# Patient Record
Sex: Female | Born: 1970 | Race: White | Hispanic: No | Marital: Married | Smoking: Former smoker
Health system: Southern US, Community
[De-identification: ages and names within clinical notes are randomized; demographics above are authoritative.]

## PROBLEM LIST (undated history)

## (undated) DIAGNOSIS — N809 Endometriosis, unspecified: Secondary | ICD-10-CM

## (undated) DIAGNOSIS — I10 Essential (primary) hypertension: Secondary | ICD-10-CM

## (undated) DIAGNOSIS — K56609 Unspecified intestinal obstruction, unspecified as to partial versus complete obstruction: Secondary | ICD-10-CM

## (undated) DIAGNOSIS — G4730 Sleep apnea, unspecified: Secondary | ICD-10-CM

## (undated) DIAGNOSIS — E785 Hyperlipidemia, unspecified: Secondary | ICD-10-CM

## (undated) HISTORY — DX: Hyperlipidemia, unspecified: E78.5

## (undated) HISTORY — PX: CHOLECYSTECTOMY: SHX55

## (undated) HISTORY — PX: ABDOMINAL HYSTERECTOMY: SHX81

## (undated) HISTORY — DX: Sleep apnea, unspecified: G47.30

## (undated) HISTORY — DX: Essential (primary) hypertension: I10

---

## 2006-12-21 ENCOUNTER — Ambulatory Visit: Payer: Self-pay | Admitting: Gynecology

## 2007-05-26 ENCOUNTER — Emergency Department: Payer: Self-pay | Admitting: Emergency Medicine

## 2007-09-27 ENCOUNTER — Ambulatory Visit: Payer: Self-pay | Admitting: Internal Medicine

## 2008-02-14 ENCOUNTER — Encounter (INDEPENDENT_AMBULATORY_CARE_PROVIDER_SITE_OTHER): Payer: Self-pay | Admitting: Gynecology

## 2008-02-14 ENCOUNTER — Ambulatory Visit: Payer: Self-pay | Admitting: Gynecology

## 2008-06-08 ENCOUNTER — Ambulatory Visit: Payer: Self-pay | Admitting: Family Medicine

## 2008-07-11 ENCOUNTER — Encounter: Payer: Self-pay | Admitting: Family Medicine

## 2008-07-11 ENCOUNTER — Ambulatory Visit: Payer: Self-pay | Admitting: Family Medicine

## 2008-07-11 ENCOUNTER — Ambulatory Visit (HOSPITAL_COMMUNITY): Admission: RE | Admit: 2008-07-11 | Discharge: 2008-07-14 | Payer: Self-pay | Admitting: Gynecology

## 2008-07-25 ENCOUNTER — Ambulatory Visit: Payer: Self-pay | Admitting: Family Medicine

## 2008-07-26 ENCOUNTER — Encounter: Payer: Self-pay | Admitting: Family Medicine

## 2008-08-22 ENCOUNTER — Ambulatory Visit: Payer: Self-pay | Admitting: Obstetrics and Gynecology

## 2009-01-25 ENCOUNTER — Ambulatory Visit: Payer: Self-pay | Admitting: Obstetrics & Gynecology

## 2009-08-25 HISTORY — PX: AUGMENTATION MAMMAPLASTY: SUR837

## 2010-06-04 ENCOUNTER — Ambulatory Visit: Payer: Self-pay | Admitting: Obstetrics & Gynecology

## 2010-06-07 ENCOUNTER — Ambulatory Visit (HOSPITAL_COMMUNITY): Admission: RE | Admit: 2010-06-07 | Discharge: 2010-06-07 | Payer: Self-pay | Admitting: Obstetrics & Gynecology

## 2010-07-01 ENCOUNTER — Ambulatory Visit (HOSPITAL_COMMUNITY): Admission: RE | Admit: 2010-07-01 | Discharge: 2010-07-01 | Payer: Self-pay | Admitting: Obstetrics and Gynecology

## 2010-07-10 ENCOUNTER — Ambulatory Visit (HOSPITAL_COMMUNITY): Admission: RE | Admit: 2010-07-10 | Discharge: 2010-07-10 | Payer: Self-pay | Admitting: Gastroenterology

## 2010-07-24 ENCOUNTER — Ambulatory Visit: Payer: Self-pay | Admitting: Obstetrics & Gynecology

## 2010-09-15 ENCOUNTER — Encounter: Payer: Self-pay | Admitting: Gastroenterology

## 2011-01-07 NOTE — Op Note (Signed)
NAMESHELLIE, ROGOFF                ACCOUNT NO.:  [id number]   MEDICAL RECORD NO.:  [id number]          PATIENT TYPE:  OIB   LOCATION:  9303                          FACILITY:  [facility]   PHYSICIAN:  Tanya S. Shawnie Pons, M.D.   DATE OF BIRTH:  1970/09/26   DATE OF PROCEDURE:  07/11/2008  DATE OF DISCHARGE:                               OPERATIVE REPORT   PREOPERATIVE DIAGNOSES:  Pelvic pain and endometriosis.   POSTOPERATIVE DIAGNOSES:  Pelvic pain and endometriosis.   PROCEDURE:  Laparoscopic-assisted vaginal hysterectomy and bilateral  salpingo-oophorectomy.   SURGEON:  [name]. Shawnie Pons, MD   ASSISTANT:  Javier Glazier. Okey Dupre, MD   ANESTHESIA:  General and local.   FINDINGS:  Left ovarian cyst and endometriosis.   SPECIMENS:  Tubes, ovaries, and uterus with cervix.   DISPOSITION:  Specimens to Pathology.   ESTIMATED BLOOD LOSS:  250 mL.   COMPLICATIONS:  None known.   REASON FOR PROCEDURE:  Briefly, the patient is a 40 year old gravida 3,  para 1-0-2-2, who has a long history of endometriosis and chronic pelvic  pain.  She has been on Depo-Lupron for over a year without significant  relief.  She has also had endometriosis ablation done laparoscopically  and in fact had had multiple laparoscopies, at least 4 plus, and  laparotomy for C-section.  The patient had been counseled and desired  permanent treatment.  The surgery had previously been performed by Dr.  Blima Rich and was done in his absence by myself.  For all of her  previous surgeries plus a history of endometriosis, an LAVH is felt to  be the prudent way to go.   PROCEDURE IN DETAIL:  The patient was taken to the OR, where she was  placed in the dorsal lithotomy in [place] stirrups.  She was prepped and  draped in the usual sterile fashion.  A Foley catheter was placed inside  the bladder.  A speculum was placed inside the vagina.  The cervix was  visualized and grasped anteriorly with single-tooth tenaculum and a  Hulka  tenaculum placed through the cervix.  The single-tooth and  speculum were removed from the vagina.  Attention was then turned to the  abdomen.  A 0.25% Marcaine was injected in the umbilicus.  A knife was  used to make an incision through the umbilicus.  This was carried down  to the fascia bilaterally and then the peritoneal cavity was entered  bluntly with hemostat.  There were no adhesions noted at the site of  incision.  The fascia on the either side was then tacked with a 0-Vicryl  suture on the UR-6 and a fine trocar was placed inside the opening.  A  pneumoperitoneum was created.  The uterus, tubes, and ovaries were  looked at and were felt to be fairly free of adhesions, hence a decision  was made to proceed with the laparoscopic portion of the hysterectomy.  The two 5-mm ports were placed under direct visualization.  The  patient's right ureter was identified well below the IP, and the IP was  taken with the  Ensure device.  After this was taken, the broad was taken  in several bites until the round was encountered.  The round was taken  down as well and a few more bites of the broad taken to the level of the  uterine.  Attempt was made to create bladder flap on this site; however,  it was not easily done.  Attention was then turned to the left.  Left  ureter was much more difficult to see, so the tubo-ovarian pedicle was  taken down with the Ensure, then the broad taken and then the round.  The anterior flap of the broad could then be entered and the bladder  flap created almost half way across the uterus without difficulty.  The  camera and the light were turned off,  the pneumoperitoneum was  disengaged, and attention was turned to the vagina.  A weighted speculum  was placed inside the vagina and the cervix grasped with 2 double-tooth  tenaculum and injected with Pitressin.  A circumferential incision was  made about the cervix and blunt dissection was used to remove the  vagina  from the cervix.  The posterior peritoneum was entered with the incision  and the posterior peritoneum was tacked to the vagina with the suture.  The anterior peritoneum was then entered sharply without difficulty.  The uterosacral ligaments were then clamped, cut, and suture ligated  with a Heaney-type suture.  Each of these was tagged.  The uterine  artery pedicles were then clamped, cut, and suture ligated with  excellent hemostasis being noted.  One more bite was made on each side  to complete the hysterectomy, which was done.  The patient's right tube  and ovary came out with the specimen.  Once this was done, wet lap pad  was placed inside the vagina, and pneumoperitoneum was recreated.  Attention was returned to the top.  The left IP could not be easily  delineated and so just as close to the ovary as we could get, we took  down the IP and the tube with excellent hemostasis being noted.  All  pedicles were looked at inside.  There was excellent hemostasis  everywhere, and so the ovary was left at the cuff and the  pneumoperitoneum was left down.  All trocars were removed under direct  visualization.  The 5-mm ports were closed with 4-0 Vicryl suture in a  running subcuticular fashion at the umbilicus, 2 figure-of-eight with  the aforementioned 0-Vicryl on the UR-6 were used to close the fascia  and a 3-0 Vicryl on a X-1 needle was used to do a subcuticular closure  of the umbilicus.  Attention was then returned to the vagina, where the  lap pads were removed.  The ovary and tube were removed.  There was  bleeding at the posterior cuff, and so a 0-Vicryl suture in a locked-  running fashion with inclusion of the uterosacral ligaments and a locked-  running fashion was used to achieve hemostasis.  There was also some  bleeding at the patient's left cuff edge and a figure-of-eight was used  here to achieve hemostasis.  The vagina was then packed with packing  soaked in  clindamycin cream.  All instrument, needle, and lap counts  were correct x2.  The patient was awakened and taken to recovery room in  stable condition.      [name]. Shawnie Pons, M.D.  Electronically Signed     TSP/MEDQ  D:  07/11/2008  T:  07/12/2008  Job:  [id number]

## 2011-01-07 NOTE — Assessment & Plan Note (Signed)
NAMEMARYN, Terri Sanchez                ACCOUNT NO.:  [id number]   MEDICAL RECORD NO.:  [id number]          PATIENT TYPE:  POB   LOCATION:  [facility] at [facility]         FACILITY:  [facility]   PHYSICIAN:  Allie Bossier, MD        DATE OF BIRTH:  11/04/1970   DATE OF SERVICE:  06/04/2010                                  CLINIC NOTE   Terri Sanchez is a 40 year old married white gravida 3, para 2, abortus 1.  She comes in here for annual exam.  She has several complaints, one is  fatigue and weight gain.  Please note that she has increased to 6 pounds  in the last year.  She also complains of IBS symptoms, which is no  different than her usual complaint, although it is now diarrhea instead  of constipation and her major GYN complaint today is pain on her left  side that began about 3 weeks ago.  She says that she notices it more  when she lies down but that may be just because she is more focused on  it.  When she lies down, it may radiate a little bit to her back.  She  has point tenderness with deep dyspareunia on the left side.  Depression-  type symptoms including no energy.  She says she gets angry and tearful.  She actually says she is more depressed and she complains of decreased  libido.   PAST MEDICAL HISTORY:  Endometriosis, chronic pelvic pain, infertility,  IBS, and she has been on Wellbutrin in the past.   PAST SURGICAL HISTORY:  Multiple laparoscopies, bilateral partial  salpingectomy, laparoscopic-assisted vaginal hysterectomy and bilateral  salpingo-oophorectomy, bilateral saline breast implants.   ALLERGIES:  DOXYCYCLINE.  No latex allergies.   MEDICATIONS:  Ibuprofen p.r.n., multivitamin daily, Vivelle-Dot 0.1 mg  patch change weekly.   PHYSICAL EXAMINATION:  GENERAL:  Well-nourished, well-hydrated, pleasant  white female, at times tearful.  VITAL SIGNS:  Height 5 feet 6 inches, weight 139, blood pressure 110/75,  pulse 63.  HEENT:  Normal.  BREAST:  Normal with implants.  HEART:  Regular rate and rhythm.  LUNGS:  Clear sensation bilaterally.  ABDOMEN:  Benign, relatively scaphoid.  EXTERNAL GENITALIA:  Shaved, no lesions.  Vaginal cuff well  estrogenized, normal discharge.  Bimanual exam, there are no masses.  She does have point tenderness AT the left aspect of the vaginal cuff.   ASSESSMENT AND PLAN:  1. Annual exam.  I have schedule a mammogram and recommended self-      breast and self-vulvar exams.  2. Lack of energy, depression symptoms.  I am checking a TSH and      restarting her Wellbutrin XL 150 mg daily.  3. Irritable bowel syndrome.  I am referring her to Dr. Graylin Shiver.      I will see her back in 4 weeks.  4. Left lower quadrant pain and dyspareunia.  I am going to order a CT      of her abdomen to see if there is anything specific that I can use      to help me diagnose the origin of her pelvic pain.  Allie Bossier, MD     MCD/MEDQ  D:  06/04/2010  T:  06/05/2010  Job:  [id number]   cc:   Graylin Shiver, MD

## 2011-01-07 NOTE — Discharge Summary (Signed)
Terri Sanchez, Terri Sanchez                ACCOUNT NO.:  [id number]   MEDICAL RECORD NO.:  [id number]          PATIENT TYPE:  OIB   LOCATION:  9303                          FACILITY:  [facility]   PHYSICIAN:  Tanya S. Shawnie Pons, M.D.   DATE OF BIRTH:  06/15/1971   DATE OF ADMISSION:  07/11/2008  DATE OF DISCHARGE:  07/14/2008                               DISCHARGE SUMMARY   FINAL DIAGNOSES:  1. Pelvic pain.  2. Endometriosis.  3. Depression.  4. History of infertility.  5. Tobacco use.   PROCEDURE:  Laparoscopically assisted vaginal hysterectomy with  bilateral salpingo-oophorectomy.   PERSISTENT LABORATORY DATA:  Preoperative hemoglobin 14.0, postoperative  hemoglobin too low at 7.4, but up to 8.4 three hours later.  Blood type  was A positive.  Antibody screen was negative. Serum pregnancy test was  also negative.   REASON FOR ADMISSION:  Briefly, please see H&P on the chart.  The  patient is a 40 year old gravida 3, para 1-0-2-2 who has a long history  of endometriosis and chronic pelvic pain who has failed a year's  treatment of Depo-Lupron, excision of endometriosis by laparoscopy who  desired permanent treatment.  She had previously been counseled by Dr.  Mia Creek about LAVH-BSO, who was no longer with the practice.  The  patient had a history of laparoscopy x4, partial bilateral salpingectomy  secondary to ectopic pregnancy, and a history of C-section.  For that  reason, laparoscopically assisted vaginal hysterectomy was undertaken to  make sure there were no significant adhesions.   HOSPITAL COURSE:  The patient was admitted on the day of surgery.  She  underwent the above procedure without difficulty.  Postoperatively, she  was transferred to the floor.  She was noted to have significant drop in  hemoglobin and she was somewhat dizzy when she got out of the hand.  However, her vital signs were stable and after 24 hours she seems  stabilize, and was able to ambulate without difficult.   The patient also  spiked a temp on postoperative day #2, and says she was kept for an  additional 24 hours.  The patient was able to tolerate regular diet.  Her Foley catheter was removed.  She was able to void without  difficulty, and she was ambulating without assistance.   Given all of these things, it was felt she was stable for discharge on  postoperative day #3.  The patient did require placement of estrogen  patch secondary to night sweats on postoperative day #2.   DISCHARGE DISPOSITION AND CONDITION:  The patient is discharged to home  in good condition.  Followup will be in two weeks at the [facility].  She will be sent home with Percocet 5/325 one to two p.o. q4-6  h. p.r.n. pain, #40 given.  Vivelle-Dot 0.05 apply to lower abdomen  twice weekly.  She was also instructed to return with fever, chills,  persistent nausea and vomiting, or significant abdominal pain.   DISCHARGE INSTRUCTIONS:  Also include no heavy lifting for the next 4  weeks and no intercourse for 4 weeks  as well.      [name]. Shawnie Pons, M.D.  Electronically Signed     TSP/MEDQ  D:  07/14/2008  T:  07/14/2008  Job:  [id number]

## 2011-01-07 NOTE — Assessment & Plan Note (Signed)
Terri Sanchez, Terri Sanchez                ACCOUNT NO.:  [id number]   MEDICAL RECORD NO.:  [id number]          PATIENT TYPE:  POB   LOCATION:  [facility]         FACILITY:  [facility]   PHYSICIAN:  Argentina Donovan, MD        DATE OF BIRTH:  [birth year]   DATE OF SERVICE:  08/22/2008                                  CLINIC NOTE   The patient is a 40 year old Caucasian female who underwent total  laparoscopic-assisted vaginal hysterectomy with bilateral salpingo-  oophorectomy for endometriosis 6 weeks ago.  She has had no significant  postoperative problems except need for an increase in her estrogen.  Vivelle 0.1 seems to be doing well for her right now and we will keep  her on that dose.  The only negative feeling that she has had is at the  end of emptying her bladder, she feels a pulling sensation that will  probably go away.  The incisions seem well healed __________ and the  patient has made satisfactory recovery.            ______________________________  Argentina Donovan, MD     PR/MEDQ  D:  08/22/2008  T:  08/23/2008  Job:  [id number]

## 2011-01-07 NOTE — Assessment & Plan Note (Signed)
Terri Sanchez, Terri Sanchez                ACCOUNT NO.:  [id number]   MEDICAL RECORD NO.:  [id number]          PATIENT TYPE:  POB   LOCATION:  [facility] at [facility]         FACILITY:  [facility]   PHYSICIAN:  Tinnie Gens, MD        DATE OF BIRTH:  1971-05-19   DATE OF SERVICE:  06/08/2008                                  CLINIC NOTE   CHIEF COMPLAINT:  Reschedule surgery.   HISTORY OF PRESENT ILLNESS:  The patient is a 40 year old gravida 3,  para 1-0-2-2, who has a longstanding history of endometriosis.  The  patient was seen by Dr. Blima Rich in June of 2009, will scheduled  for a LAVH and BSO with possible exploratory laparotomy.  Since Dr.  Mia Creek has left the practice, the patient is here to reschedule this.  He has a lengthy note.  Please see dictation from this day, but it  basically outlines all the laparoscopies and treatments that she has had  including bilateral partial salpingectomies for ectopic on each side as  well as history of IVF and failed IVF following delivery of a twins.  The patient's last laparoscopy occurred in 2005 in [place] and her  doctor there told her that she was beginning to get adhesions and scar  tissue built up from previous surgeries.  The patient has had multiple  medical treatments and none of them have really relieved her pain.  The  patient is  interested in this kind of treatment.  Lengthy discussion  was held with the patient regarding risks and benefits of this procedure  including risk of injury to bowel, bladder, ureters, risk of bleeding,  and infection.  The patient understands all these risks, did discuss  what would be needed postoperatively, how long in the hospital,  reasonable expectations for recovery.  Discussed with the patient while  we would probably be taking out her ovaries, given that she does have  diagnosis of endometriosis and that we would try how to get  back in for  surgery and needs surgery cares with more and more risk.   The patient is  worried about hormones and hormone replacement and all of this was  discussed at length with the patient.   IMPRESSION:  Endometriosis.   PLAN:  We will test again for a laparoscopic-assisted vaginal  hysterectomy with BSO mostly so that we can make sure there are no  significant adhesions of bowel or bladder prior to [facility].  We will try to  get her scheduled for July 11, 2008.  This patient has in-laws  coming and taking care of her kids.           ______________________________  Tinnie Gens, MD     TP/MEDQ  D:  06/08/2008  T:  06/09/2008  Job:  [id number]

## 2011-01-07 NOTE — Assessment & Plan Note (Signed)
Terri Sanchez, Terri Sanchez                ACCOUNT NO.:  [id number]   MEDICAL RECORD NO.:  [id number]          PATIENT TYPE:  POB   LOCATION:  [facility] at [facility]         FACILITY:  [facility]   PHYSICIAN:  Allie Bossier, MD        DATE OF BIRTH:  02/22/1971   DATE OF SERVICE:  07/24/2010                                  CLINIC NOTE   [name] is a 40 year old married white G3, P69, A52 with 46-year-old  twins.  She is here for followup visit.  At her annual visit last month,  she was complaining of fatigue, irritability, lack of energy, and  feeling like she was going to jump out of her skin especially at  night.  I restarted her Wellbutrin 150 mg and she is following up now.  Today, her symptoms are somewhat better but she says she still feels  like she is going to jump out of her skin at night.  I did check a TSH  and it was normal.  I have told her that if she wants further relief  that we probably should set her up with a psychiatrist.  I do believe  that many of her symptoms are because she is so absolutely stressed at  work.  She is a [employer] (she will get the [employer] next month)  and she is very very busy at work, but we will set her up with  psychiatrist.      Allie Bossier, MD     MCD/MEDQ  D:  07/24/2010  T:  07/25/2010  Job:  [id number]

## 2011-01-07 NOTE — Assessment & Plan Note (Signed)
Terri Sanchez, Terri Sanchez                ACCOUNT NO.:  [id number]   MEDICAL RECORD NO.:  [id number]          PATIENT TYPE:  POB   LOCATION:  [facility] at [facility]         FACILITY:  [facility]   PHYSICIAN:  Ginger Carne, MD DATE OF BIRTH:  02-05-1971   DATE OF SERVICE:  02/14/2008                                  CLINIC NOTE   This patient is a 40 year old, gravida 3, para 1-0-2-2, Caucasian female  who has had a longstanding history over the past 5-10 years of  significant symptomatology related to endometriosis.  The patient has  had a laparoscopic diagnosis of same in 2004 and 2005.  She had  undergone laparoscopic excision and ablation of endometriosis in [place]  in 2005 at which time she was diagnosed with stage 3 endometriosis.  Since then, she has been struggling with dysmenorrhea, dyspareunia, and  pain throughout the month.  The patient is at a point now where she is  unable to engage in intercourse.  She has been on a course of a Depo-  Lupron for over 1 year, norethindrone acetate with and without oral  contraceptives, and Depo-Provera without benefit.  She had ever utilized  Danocrine.  The patient had attempted in June 2008 in vitro  fertilization twice unsuccessfully.  It was the opinion of the  Reproductive Endocrinology Department that her ovaries had endometriomas  and were not receptive to assisted reproductive technology medication.  The patient has had bilateral salpingectomies due to ectopic pregnancies  as the reason for her in vitro efforts.   In addition, the patient has had symptoms related to depression.  She  has a set of twins who are 42 years old.  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  She has  bouts of crying and difficulty coping.  The patient had sought counsel  as far as dealing with these issues as well.  There is no evidence for  psychological or physical domestic violence and the patient has not  had  a past history of depression or bipolar disease.   The patient has no GI, GU, or musculoskeletal sources for her pelvic  pain and specifically has no symptomatology of an overactive bladder or  interstitial cystitis.   OB/GYN HISTORY:  The patient has had in vitro fertilization resulting in  twin gestation.  She has also had 2 first trimester miscarriages.   ALLERGIES:  DOXYCYCLINE   CURRENT MEDICATIONS:  None.   PAST SURGICAL HISTORY:  The patient in 2004 had a diagnostic laparoscopy  revealing stage 3 endometriosis, which was confirmed by laparoscopic  excision of endometriosis in 2005 in [place].  The patient has not had  any other form of surgery.   SOCIAL HISTORY:  The patient has started smoking 3 months ago, but was  encouraged to stop.  Denies alcohol or illicit drug abuse.   FAMILY HISTORY:  No first-degree relatives with breast, colon, ovarian,  or uterine carcinoma.   MEDICAL HISTORY:  None.   REVIEW OF SYSTEMS:  A 14-point comprehensive review of systems within  normal limits.   PHYSICAL EXAMINATION:  VITAL SIGNS:  Blood pressure  110/58, weight 132  pounds, height 5 feet 7 inches, and pulse 72 and regular.  HEENT:  Grossly normal.  BREAST:  Without masses, discharge, thickenings, or tenderness.  CHEST:  Clear to percussion and auscultation.  CARDIOVASCULAR:  Without murmurs or enlargements.  Regular rate and  rhythm.  EXTREMITY, LYMPHATIC, SKIN, NEUROLOGICAL, MUSCULOSKELETAL SYSTEMS:  Within normal limits.  ABDOMEN:  Soft without gross hepatosplenomegaly.  PELVIC:  Pap smear performed.  External genitalia, vulva, and vagina  normal.  Cervix smooth without erosions or lesions.  Uterus is small,  anteverted, flexed, and tender.  Both adnexa palpable and found to be  normal.   IMPRESSION:  1. Chronic pelvic pain with endometriosis.  2. Depression.   PLAN:  The patient has had a full exposure to medical therapy for  endometriosis including Depo-Lupron,  Depo Provera, continual oral  contraceptives associated with norethindrone acetate.  Her menses are  regular about every 25-28 days, lasting 5-6 days.  She has no desire for  further attempts at in vitro fertilization as she has had both tubes  removed in the past.  The patient has decided on definitive management  of her endometriosis and has elected to undergo a laparoscopic-assisted  vaginal hysterectomy, bilateral oophorectomy with possible excision of  tubal remnants, possible appendectomy, and laparotomy.  The nature of  said procedure discussed in detail.  Risks including possible injuries  to ureter, bowel, and bladder, possible conversion to an open procedure,  hemorrhage requiring blood transfusion, infection, and other unforeseen  complications were discussed and understood by said patient.  1. In addressing the patient's depression, she was started on      Wellbutrin 150 mg XL.  She was advised as to side effects including      rash and was encouraged to take it in the morning.  She understands      it may take upwards of 4-6 weeks for the drug to be fully      beneficial.  An additional dosing or changes in medication may be      necessary.  She was also provided Xanax on an interim basis 0.5 mg      1-2 up to 3 times a day, #60 with 1 refill to deal with anxiety      until Wellbutrin settles in.  The patient will contact our office      in the next several days to arrange for surgery time and was      encouraged to contact our office if she has any untoward effects      related to Wellbutrin and/or Xanax.           ______________________________  Ginger Carne, MD     SHB/MEDQ  D:  02/14/2008  T:  02/15/2008  Job:  [id number]

## 2011-01-07 NOTE — Assessment & Plan Note (Signed)
NAMEARTI, Terri Sanchez                ACCOUNT NO.:  [id number]   MEDICAL RECORD NO.:  [id number]          PATIENT TYPE:  POB   LOCATION:  [facility] at [facility]         FACILITY:  [facility]   PHYSICIAN:  Tinnie Gens, MD        DATE OF BIRTH:  [birth year]   DATE OF SERVICE:  07/25/2008                                  CLINIC NOTE   CHIEF COMPLAINT:  Postop check.   HISTORY OF PRESENT ILLNESS:  The patient is 40 year old who was status  post LAVH.  She is really having quite a few complications.  She reports  that she has the inability to sleep, frequent night sweats and frequent  hot flashes.  She is currently on 0.05 mg of Vivelle-Dot, but continues  to have issues with menopause.  She does have painful urination, some  low back pain, and vaginal discharge that smells.  She also has a  painful bowel movements and has been on stool softeners and flaxseed  oil.   PHYSICAL EXAMINATION:  VITAL SIGNS:  Today her vitals are as noted in  the chart.  GENERAL:  She is a well-nourished female in no acute distress.  ABDOMEN:  Soft, nontender, nondistended.  Her incisions are healing  well.  GENITOURINARY:  Normal external female genitalia.  Vagina is pink and  rugated.  Incision at the top of the vagina is healing well.  There is a  pink yellow discharge noted that does have odor and appears normal.  Sutures are still intact.  There is no significant masses on top of the  vagina.  Urinalysis today shows moderate blood and 2+ leukocytes.   IMPRESSION:  1. Postop check.  2. Continued menopausal symptoms on hormone replacement.  3. Urinary tract infection.   PLAN:  1. Keflex 500 mg 1 p.o. t.i.d. for 7 days.  2. Increase Vivelle-Dot to 0.1, sample of this was given to the      patient.  3. Follow up in 4 weeks.           ______________________________  Tinnie Gens, MD     TP/MEDQ  D:  07/25/2008  T:  07/26/2008  Job:  [id number]

## 2011-05-27 LAB — CBC
HCT: 21.4 — ABNORMAL LOW
HCT: 22.4 — ABNORMAL LOW
HCT: 24.4 — ABNORMAL LOW
HCT: 40.8
Hemoglobin: 14
MCHC: 34.4
MCHC: 34.8
MCV: 93.2
MCV: 93.7
MCV: 94.4
Platelets: 188
Platelets: 194
Platelets: 220
RBC: 4.37
RDW: 12.6
RDW: 13.4
WBC: 8.9

## 2011-05-27 LAB — CROSSMATCH: ABO/RH(D): A POS

## 2011-05-27 LAB — ABO/RH: ABO/RH(D): A POS

## 2012-04-28 ENCOUNTER — Telehealth: Payer: Self-pay

## 2012-04-28 NOTE — Telephone Encounter (Signed)
Medical Records were sent on this patient to [facility] on 04/28/12 by SR

## 2016-03-19 DIAGNOSIS — M2392 Unspecified internal derangement of left knee: Secondary | ICD-10-CM | POA: Insufficient documentation

## 2016-05-29 ENCOUNTER — Inpatient Hospital Stay
Admission: EM | Admit: 2016-05-29 | Discharge: 2016-06-01 | DRG: 392 | Disposition: A | Discharge status: Home / Self Care | Payer: BLUE CROSS/BLUE SHIELD | Attending: Internal Medicine | Admitting: Internal Medicine

## 2016-05-29 ENCOUNTER — Emergency Department: Payer: BLUE CROSS/BLUE SHIELD

## 2016-05-29 ENCOUNTER — Encounter: Payer: Self-pay | Admitting: Emergency Medicine

## 2016-05-29 DIAGNOSIS — R109 Unspecified abdominal pain: Secondary | ICD-10-CM | POA: Diagnosis present

## 2016-05-29 DIAGNOSIS — K449 Diaphragmatic hernia without obstruction or gangrene: Secondary | ICD-10-CM | POA: Diagnosis present

## 2016-05-29 DIAGNOSIS — Z87891 Personal history of nicotine dependence: Secondary | ICD-10-CM | POA: Diagnosis not present

## 2016-05-29 DIAGNOSIS — K56609 Unspecified intestinal obstruction, unspecified as to partial versus complete obstruction: Secondary | ICD-10-CM | POA: Diagnosis not present

## 2016-05-29 DIAGNOSIS — Z9049 Acquired absence of other specified parts of digestive tract: Secondary | ICD-10-CM

## 2016-05-29 DIAGNOSIS — Z0189 Encounter for other specified special examinations: Secondary | ICD-10-CM

## 2016-05-29 DIAGNOSIS — K2900 Acute gastritis without bleeding: Principal | ICD-10-CM | POA: Diagnosis present

## 2016-05-29 DIAGNOSIS — Z90710 Acquired absence of both cervix and uterus: Secondary | ICD-10-CM | POA: Diagnosis not present

## 2016-05-29 DIAGNOSIS — R197 Diarrhea, unspecified: Secondary | ICD-10-CM | POA: Diagnosis present

## 2016-05-29 DIAGNOSIS — K565 Intestinal adhesions [bands], unspecified as to partial versus complete obstruction: Secondary | ICD-10-CM

## 2016-05-29 HISTORY — DX: Endometriosis, unspecified: N80.9

## 2016-05-29 HISTORY — DX: Unspecified intestinal obstruction, unspecified as to partial versus complete obstruction: K56.609

## 2016-05-29 LAB — TROPONIN I

## 2016-05-29 LAB — CBC
HCT: 41.1 % (ref 35.0–47.0)
HEMOGLOBIN: 14.6 g/dL (ref 12.0–16.0)
MCH: 32.9 pg (ref 26.0–34.0)
MCHC: 35.5 g/dL (ref 32.0–36.0)
MCV: 92.5 fL (ref 80.0–100.0)
Platelets: 240 10*3/uL (ref 150–440)
RBC: 4.45 MIL/uL (ref 3.80–5.20)
RDW: 12.2 % (ref 11.5–14.5)
WBC: 7.3 10*3/uL (ref 3.6–11.0)

## 2016-05-29 LAB — BASIC METABOLIC PANEL
ANION GAP: 8 (ref 5–15)
BUN: 10 mg/dL (ref 6–20)
CALCIUM: 9.6 mg/dL (ref 8.9–10.3)
CO2: 26 mmol/L (ref 22–32)
Chloride: 102 mmol/L (ref 101–111)
Creatinine, Ser: 0.83 mg/dL (ref 0.44–1.00)
Glucose, Bld: 168 mg/dL — ABNORMAL HIGH (ref 65–99)
Potassium: 3.8 mmol/L (ref 3.5–5.1)
Sodium: 136 mmol/L (ref 135–145)

## 2016-05-29 LAB — HEPATIC FUNCTION PANEL
ALT: 42 U/L (ref 14–54)
AST: 43 U/L — AB (ref 15–41)
Albumin: 4.5 g/dL (ref 3.5–5.0)
Alkaline Phosphatase: 82 U/L (ref 38–126)
TOTAL PROTEIN: 7.8 g/dL (ref 6.5–8.1)
Total Bilirubin: 1.1 mg/dL (ref 0.3–1.2)

## 2016-05-29 LAB — LIPASE, BLOOD: Lipase: 43 U/L (ref 11–51)

## 2016-05-29 MED ORDER — HYDROMORPHONE HCL 1 MG/ML IJ SOLN
1.0000 mg | Freq: Once | INTRAMUSCULAR | Status: AC
  Administered 2016-05-29: 1 mg via INTRAVENOUS
  Filled 2016-05-29: qty 1

## 2016-05-29 MED ORDER — HEPARIN SODIUM (PORCINE) 5000 UNIT/ML IJ SOLN
5000.0000 [IU] | Freq: Three times a day (TID) | INTRAMUSCULAR | Status: DC
  Administered 2016-05-29 – 2016-05-30 (×2): 5000 [IU] via SUBCUTANEOUS
  Filled 2016-05-29 (×2): qty 1

## 2016-05-29 MED ORDER — GI COCKTAIL ~~LOC~~
30.0000 mL | Freq: Once | ORAL | Status: AC
  Administered 2016-05-29: 30 mL via ORAL
  Filled 2016-05-29: qty 30

## 2016-05-29 MED ORDER — FAMOTIDINE IN NACL 20-0.9 MG/50ML-% IV SOLN
20.0000 mg | Freq: Two times a day (BID) | INTRAVENOUS | Status: DC
  Administered 2016-05-29: 20 mg via INTRAVENOUS
  Filled 2016-05-29 (×3): qty 50

## 2016-05-29 MED ORDER — IOPAMIDOL (ISOVUE-300) INJECTION 61%
100.0000 mL | Freq: Once | INTRAVENOUS | Status: AC | PRN
  Administered 2016-05-29: 100 mL via INTRAVENOUS

## 2016-05-29 MED ORDER — KETOROLAC TROMETHAMINE 30 MG/ML IJ SOLN
30.0000 mg | Freq: Four times a day (QID) | INTRAMUSCULAR | Status: DC
  Administered 2016-05-30 – 2016-05-31 (×6): 30 mg via INTRAVENOUS
  Filled 2016-05-29 (×6): qty 1

## 2016-05-29 MED ORDER — ONDANSETRON HCL 4 MG/2ML IJ SOLN
4.0000 mg | Freq: Once | INTRAMUSCULAR | Status: AC
Start: 2016-05-29 — End: 2016-05-29
  Administered 2016-05-29: 4 mg via INTRAVENOUS
  Filled 2016-05-29: qty 2

## 2016-05-29 MED ORDER — ONDANSETRON HCL 4 MG/2ML IJ SOLN
4.0000 mg | Freq: Once | INTRAMUSCULAR | Status: AC
  Administered 2016-05-29: 4 mg via INTRAVENOUS
  Filled 2016-05-29: qty 2

## 2016-05-29 MED ORDER — SODIUM CHLORIDE 0.9 % IV SOLN
Freq: Once | INTRAVENOUS | Status: AC
  Administered 2016-05-29: 1000 mL via INTRAVENOUS

## 2016-05-29 MED ORDER — PHENOL 1.4 % MT LIQD
1.0000 | OROMUCOSAL | Status: DC | PRN
  Administered 2016-05-30 (×2): 1 via OROMUCOSAL
  Filled 2016-05-29: qty 177

## 2016-05-29 MED ORDER — IOPAMIDOL (ISOVUE-300) INJECTION 61%
30.0000 mL | Freq: Once | INTRAVENOUS | Status: AC | PRN
  Administered 2016-05-29: 30 mL via ORAL

## 2016-05-29 MED ORDER — SODIUM CHLORIDE 0.9 % IV BOLUS (SEPSIS)
500.0000 mL | Freq: Once | INTRAVENOUS | Status: AC
  Administered 2016-05-29: 500 mL via INTRAVENOUS

## 2016-05-29 MED ORDER — ONDANSETRON HCL 4 MG/2ML IJ SOLN
4.0000 mg | Freq: Four times a day (QID) | INTRAMUSCULAR | Status: DC | PRN
  Administered 2016-05-30: 4 mg via INTRAVENOUS
  Filled 2016-05-29: qty 2

## 2016-05-29 MED ORDER — ONDANSETRON 4 MG PO TBDP: 4.0000 mg | ORAL_TABLET | Freq: Four times a day (QID) | ORAL | Status: DC | PRN

## 2016-05-29 MED ORDER — LACTATED RINGERS IV SOLN
125.0000 mL/h | INTRAVENOUS | Status: DC
  Administered 2016-05-29 – 2016-05-30 (×2): 125 mL/h via INTRAVENOUS

## 2016-05-29 MED ORDER — HYDROMORPHONE HCL 1 MG/ML IJ SOLN
0.5000 mg | INTRAMUSCULAR | Status: DC | PRN
  Administered 2016-05-29 – 2016-05-30 (×3): 0.5 mg via INTRAVENOUS
  Filled 2016-05-29 (×3): qty 1

## 2016-05-29 NOTE — H&P (Signed)
Date of Admission:  05/29/2016  Reason for Admission:  Small bowel obstruction  History of Present Illness: Terri Sanchez is a 45 y.o. female who presents with a 4-6 day history of abdominal discomfort.  Patient reports that it started with her feeling bloated and with her abdomen feeling tighter and this has progressed to nausea although no episodes of emesis and now also she has been having lightheadedness and some dizziness. During this time though she does report having flatus and liquid bowel movements which is her normal. She presented today for further evaluation and was found to have a partial small bowel obstruction with proximal dilated small bowel loops and a transition point in the distal small bowel. She denies any fevers, chills, chest pain, or shortness of breath. She also denies any hematuria dysuria or blood in the stools.  Past Medical History: Past Medical History:  Diagnosis Date  . Bowel obstruction x1   . Endometriosis      Past Surgical History: Past Surgical History:  Procedure Laterality Date  . ABDOMINAL HYSTERECTOMY    . CESAREAN SECTION    . CHOLECYSTECTOMY Appendectomy Multiple laparoscopies for endometriosis and ectopic pregnancy      Home Medications: Prior to Admission medications   Medication Sig Start Date End Date Taking? Authorizing Provider  acidophilus (RISAQUAD) CAPS capsule Take 1 capsule by mouth daily.   Yes Historical Provider, MD  Multiple Vitamin (MULTIVITAMIN) tablet Take 1 tablet by mouth daily.   Yes Historical Provider, MD  OMEGA-3-ACID ETHYL ESTERS PO Take 1 capsule by mouth daily.   Yes Historical Provider, MD    Allergies: Allergies  Allergen Reactions  . Doxycycline Anaphylaxis    Social History:  reports that she has quit smoking. She has never used smokeless tobacco. She reports that she drinks about 1.2 oz of alcohol per week . She reports that she does not use drugs.   Family History: No family history on  file.  Review of Systems: Review of Systems  Constitutional: Positive for malaise/fatigue. Negative for chills and fever.  Eyes: Negative for blurred vision.  Respiratory: Negative for cough and shortness of breath.   Cardiovascular: Negative for chest pain and leg swelling.  Gastrointestinal: Positive for abdominal pain and nausea. Negative for blood in stool, constipation, diarrhea, heartburn and vomiting.  Genitourinary: Negative for dysuria and hematuria.  Musculoskeletal: Negative for myalgias.  Skin: Negative for rash.  Neurological: Positive for dizziness. Negative for headaches.  Psychiatric/Behavioral: Negative for depression.    Physical Exam BP (!) 142/100   Pulse 80   Temp 97.7 F (36.5 C) (Oral)   Resp 18   Ht 5\' 7"  (1.702 m)   Wt 65.8 kg (145 lb)   SpO2 96%   BMI 22.71 kg/m  CONSTITUTIONAL: No acute distress HEENT:  Normocephalic, atraumatic, extraocular motion intact. NECK: Trachea is midline, and there is no jugular venous distension.  LYMPH NODES:  Lymph nodes in the neck are not enlarged. RESPIRATORY:  Lungs are clear, and breath sounds are equal bilaterally. Normal respiratory effort without pathologic use of accessory muscles. CARDIOVASCULAR: Heart is regular without murmurs, gallops, or rubs. GI: The abdomen is soft, nondistended, with mild tenderness in the mid abdomen and epigastric region. Patient has multiple laparoscopic scars consistent with her previous surgeries. There were no palpable masses. There was no hepatosplenomegaly. MUSCULOSKELETAL:  Normal muscle strength and tone in all four extremities.  No peripheral edema or cyanosis. SKIN: Skin turgor is normal. There are no pathologic skin lesions.  NEUROLOGIC:  Motor and sensation is grossly normal.  Cranial nerves are grossly intact. PSYCH:  Alert and oriented to person, place and time. Affect is normal.  Laboratory Analysis: Results for orders placed or performed during the hospital encounter of  05/29/16 (from the past 24 hour(s))  Basic metabolic panel     Status: Abnormal   Collection Time: 05/29/16 11:13 AM  Result Value Ref Range   Sodium 136 135 - 145 mmol/L   Potassium 3.8 3.5 - 5.1 mmol/L   Chloride 102 101 - 111 mmol/L   CO2 26 22 - 32 mmol/L   Glucose, Bld 168 (H) 65 - 99 mg/dL   BUN 10 6 - 20 mg/dL   Creatinine, Ser 1.61 0.44 - 1.00 mg/dL   Calcium 9.6 8.9 - 09.6 mg/dL   GFR calc non Af Amer >60 >60 mL/min   GFR calc Af Amer >60 >60 mL/min   Anion gap 8 5 - 15  CBC     Status: None   Collection Time: 05/29/16 11:13 AM  Result Value Ref Range   WBC 7.3 3.6 - 11.0 K/uL   RBC 4.45 3.80 - 5.20 MIL/uL   Hemoglobin 14.6 12.0 - 16.0 g/dL   HCT 04.5 40.9 - 81.1 %   MCV 92.5 80.0 - 100.0 fL   MCH 32.9 26.0 - 34.0 pg   MCHC 35.5 32.0 - 36.0 g/dL   RDW 91.4 78.2 - 95.6 %   Platelets 240 150 - 440 K/uL  Troponin I     Status: None   Collection Time: 05/29/16 11:13 AM  Result Value Ref Range   Troponin I <0.03 <0.03 ng/mL  Hepatic function panel     Status: Abnormal   Collection Time: 05/29/16 11:13 AM  Result Value Ref Range   Total Protein 7.8 6.5 - 8.1 g/dL   Albumin 4.5 3.5 - 5.0 g/dL   AST 43 (H) 15 - 41 U/L   ALT 42 14 - 54 U/L   Alkaline Phosphatase 82 38 - 126 U/L   Total Bilirubin 1.1 0.3 - 1.2 mg/dL   Bilirubin, Direct <2.1 (L) 0.1 - 0.5 mg/dL   Indirect Bilirubin NOT CALCULATED 0.3 - 0.9 mg/dL  Lipase, blood     Status: None   Collection Time: 05/29/16 11:13 AM  Result Value Ref Range   Lipase 43 11 - 51 U/L    Imaging: Dg Chest 2 View  Result Date: 05/29/2016 CLINICAL DATA:  Chest pain. EXAM: CHEST  2 VIEW COMPARISON:  No recent prior . FINDINGS: Mediastinum and hilar structures normal. Lungs are clear. No pleural effusion or pneumothorax. Heart size normal. IMPRESSION: No acute cardiopulmonary disease. Electronically Signed   By: Maisie Fus  Register   On: 05/29/2016 13:24   Ct Abdomen Pelvis W Contrast  Result Date: 05/29/2016 CLINICAL DATA:   Epigastric abdominal pain. Previous cholecystectomy, hysterectomy, C-section and small bowel obstruction. Ex-smoker. EXAM: CT ABDOMEN AND PELVIS WITH CONTRAST TECHNIQUE: Multidetector CT imaging of the abdomen and pelvis was performed using the standard protocol following bolus administration of intravenous contrast. CONTRAST:  ISOVUE-300 IOPAMIDOL (ISOVUE-300) INJECTION 61% COMPARISON:  06/07/2010. FINDINGS: Lower chest: Bilateral breast implants.  Clear lung bases. Hepatobiliary: Minimal diffuse low density of the liver relative to the spleen. Surgically absent gallbladder. Pancreas: Unremarkable. No pancreatic ductal dilatation or surrounding inflammatory changes. Spleen: Normal in size without focal abnormality. Adrenals/Urinary Tract: Adrenal glands are unremarkable. Kidneys are normal, without renal calculi, focal lesion, or hydronephrosis. Bladder is unremarkable. Stomach/Bowel: Multiple dilated  proximal small bowel loops with normal caliber distal small bowel and colon. No obstructing mass seen. Multiple sigmoid colon diverticula. Probable post appendectomy surgical sutures. No evidence of appendicitis. Normal appearing stomach. Vascular/Lymphatic: Minimal aortic calcification. No enlarged lymph nodes. Reproductive: Surgically absent uterus. Probable small left ovary or ovarian remnant without significant change. No adnexal mass. Other: None. Musculoskeletal: Minimal lumbar spine degenerative changes. IMPRESSION: 1. Partial small bowel obstruction, most likely due to an adhesion. 2. Sigmoid diverticulosis. 3. Minimal diffuse hepatic steatosis. 4. Minimal aortic atherosclerosis. Electronically Signed   By: Beckie SaltsSteven  Reid M.D.   On: 05/29/2016 15:06   Dg Abd Portable 1v  Result Date: 05/29/2016 CLINICAL DATA:  Nasogastric tube placement, right-sided abdominal pain for 1 week. EXAM: PORTABLE ABDOMEN - 1 VIEW COMPARISON:  05/29/2016 CT FINDINGS: The tip of a gastric tube is seen 3.4 cm below the left  hemidiaphragm. The side-port is still above the diaphragm. Further advancement is recommended at least another 7 cm. Enteric contrast is seen within nonobstructed, nondistended appearing large bowel. Renal collecting system opacification noted from recent CT. No free air. IMPRESSION: Further advancement of a gastric tube is suggested by at least 7 cm. Side port is still above the level of the diaphragm. Electronically Signed   By: Tollie Ethavid  Kwon M.D.   On: 05/29/2016 17:46    Assessment and Plan: This is a 45 y.o. female who presents with a new episode of small bowel obstruction. I personally reviewed all her laboratory studies and imaging studies. An NG tube was already placed in the emergency room the patient reports some improvement in her symptoms after that. The NG tube was already advanced as recommended per etiology. The patient is not peritoneal and will be admitted to the surgery service for conservative management of her small bowel obstruction. It is reassuring that on the KUB that was done for tube placement check she already had contrast in her colon. We will continue the NG tube to wall suction and keep her nothing by mouth with IV fluid hydration and pain/nausea control and await for improvement in her symptoms and bowel function. The patient understands this plan and all her questions have been answered.   Howie IllJose Luis Rodney Wigger, MD [facility]

## 2016-05-29 NOTE — ED Provider Notes (Signed)
Time Seen: Approximately 1209  I have reviewed the triage notes  Chief Complaint: Chest Pain   History of Present Illness: Terri Sanchez is a 45 y.o. female who presents with a one-week history of abdominal pain with nausea and decreased appetite. Patient states a history of multiple abdominal surgeries and states she's had a previous cholecystectomy along with a postoperative bowel obstruction. She states she's had a complete hysterectomy and appendectomy also. Patient points mainly to the epigastric area and states pains radiate straight through to the back. She denies any history of significant alcohol usage. She denies any flank discomfort. She denies any anterior lower abdominal pain or chest discomfort to this historian. She denies any shortness of breath or a slight pleuritic component. She has had a history of gastritis in the past and denies any melena or hematochezia.   Past Medical History:  Diagnosis Date  . Bowel obstruction   . Endometriosis     There are no active problems to display for this patient.   Past Surgical History:  Procedure Laterality Date  . ABDOMINAL HYSTERECTOMY    . CESAREAN SECTION    . CHOLECYSTECTOMY      Past Surgical History:  Procedure Laterality Date  . ABDOMINAL HYSTERECTOMY    . CESAREAN SECTION    . CHOLECYSTECTOMY        Allergies:  Doxycycline  Family History: No family history on file.  Social History: Social History  Substance Use Topics  . Smoking status: Former Games developer  . Smokeless tobacco: Never Used     Comment: pt states she vapes  . Alcohol use 1.2 oz/week    2 Glasses of wine per week     Review of Systems:   10 point review of systems was performed and was otherwise negative:  Constitutional: No fever Eyes: No visual disturbances ENT: No sore throat, ear pain Cardiac: No chest pain Respiratory: No shortness of breath, wheezing, or stridor Abdomen:Pain is primarily epigastric region with radiation  straight through to the back Endocrine: No weight loss, No night sweats Extremities: No peripheral edema, cyanosis Skin: No rashes, easy bruising Neurologic: No focal weakness, trouble with speech or swollowing Urologic: No dysuria, Hematuria, or urinary frequency   Physical Exam:  ED Triage Vitals  Enc Vitals Group     BP 05/29/16 1111 (!) 146/86     Pulse Rate 05/29/16 1111 88     Resp 05/29/16 1111 18     Temp 05/29/16 1111 97.7 F (36.5 C)     Temp Source 05/29/16 1111 Oral     SpO2 05/29/16 1111 100 %     Weight 05/29/16 1112 145 lb (65.8 kg)     Height 05/29/16 1112 5\' 7"  (1.702 m)     Head Circumference --      Peak Flow --      Pain Score 05/29/16 1116 8     Pain Loc --      Pain Edu? --      Excl. in GC? --     General: Awake , Alert , and Oriented times 3; GCS 15 Head: Normal cephalic , atraumatic Eyes: Pupils equal , round, reactive to light Nose/Throat: No nasal drainage, patent upper airway without erythema or exudate.  Neck: Supple, Full range of motion, No anterior adenopathy or palpable thyroid masses Lungs: Clear to ascultation without wheezes , rhonchi, or rales Heart: Regular rate, regular rhythm without murmurs , gallops , or rubs Abdomen: Reproducible abdominal pain epigastric area  without rebound guarding rigidity. Bowel sounds are positive slightly hyperactive and symmetric in all 4 quadrants. No abdominal distention.        Extremities: 2 plus symmetric pulses. No edema, clubbing or cyanosis Neurologic: normal ambulation, Motor symmetric without deficits, sensory intact Skin: warm, dry, no rashes   Labs:   All laboratory work was reviewed including any pertinent negatives or positives listed below:  Labs Reviewed  BASIC METABOLIC PANEL - Abnormal; Notable for the following:       Result Value   Glucose, Bld 168 (*)    All other components within normal limits  CBC  TROPONIN I  HEPATIC FUNCTION PANEL  LIPASE, BLOOD    EKG:  ED ECG  REPORT I, Jennye MoccasinBrian S Landrey Mahurin, the attending physician, personally viewed and interpreted this ECG.  Date: 05/29/2016 EKG Time:1112 Rate: 90 Rhythm: normal sinus rhythm QRS Axis: normal Intervals: normal ST/T Wave abnormalities: normal Conduction Disturbances: none Narrative Interpretation: unremarkable    Radiology:  "Dg Chest 2 View  Result Date: 05/29/2016 CLINICAL DATA:  Chest pain. EXAM: CHEST  2 VIEW COMPARISON:  No recent prior . FINDINGS: Mediastinum and hilar structures normal. Lungs are clear. No pleural effusion or pneumothorax. Heart size normal. IMPRESSION: No acute cardiopulmonary disease. Electronically Signed   By: Maisie Fushomas  Register   On: 05/29/2016 13:24   Ct Abdomen Pelvis W Contrast  Result Date: 05/29/2016 CLINICAL DATA:  Epigastric abdominal pain. Previous cholecystectomy, hysterectomy, C-section and small bowel obstruction. Ex-smoker. EXAM: CT ABDOMEN AND PELVIS WITH CONTRAST TECHNIQUE: Multidetector CT imaging of the abdomen and pelvis was performed using the standard protocol following bolus administration of intravenous contrast. CONTRAST:  100mL ISOVUE-300 IOPAMIDOL (ISOVUE-300) INJECTION 61% COMPARISON:  06/07/2010. FINDINGS: Lower chest: Bilateral breast implants.  Clear lung bases. Hepatobiliary: Minimal diffuse low density of the liver relative to the spleen. Surgically absent gallbladder. Pancreas: Unremarkable. No pancreatic ductal dilatation or surrounding inflammatory changes. Spleen: Normal in size without focal abnormality. Adrenals/Urinary Tract: Adrenal glands are unremarkable. Kidneys are normal, without renal calculi, focal lesion, or hydronephrosis. Bladder is unremarkable. Stomach/Bowel: Multiple dilated proximal small bowel loops with normal caliber distal small bowel and colon. No obstructing mass seen. Multiple sigmoid colon diverticula. Probable post appendectomy surgical sutures. No evidence of appendicitis. Normal appearing stomach. Vascular/Lymphatic:  Minimal aortic calcification. No enlarged lymph nodes. Reproductive: Surgically absent uterus. Probable small left ovary or ovarian remnant without significant change. No adnexal mass. Other: None. Musculoskeletal: Minimal lumbar spine degenerative changes. IMPRESSION: 1. Partial small bowel obstruction, most likely due to an adhesion. 2. Sigmoid diverticulosis. 3. Minimal diffuse hepatic steatosis. 4. Minimal aortic atherosclerosis. Electronically Signed   By: Beckie SaltsSteven  Reid M.D.   On: 05/29/2016 15:06  "    I personally reviewed the radiologic studies   Procedures:  Patient received a NG tube per the nursing staff.    ED Course: Differential diagnosis includes but is not exclusive to acute cholecystitis, intrathoracic causes for epigastric abdominal pain, gastritis, duodenitis, pancreatitis, small bowel or large bowel obstruction, abdominal aortic aneurysm, hernia, gastritis, etc.  Given the patient's current clinical presentation and objective findings appears she has partial small bowel obstruction. Patient was given Dilaudid and Zofran for pain and offered some temporary improvement however her feelings of nausea and have returned. Patient's not had any persistent vomiting here in emergency department though has limited by mouth intake over the last week. Patient tolerated her study well. I reviewed the case with general surgery is agreed to see and evaluate the patient, further  disposition and management depends upon their evaluation. Clinical Course     Assessment:  Partial small bowel obstruction     Plan:  Inpatient Surgical consultation            Jennye Moccasin, MD 05/29/16 [id number]

## 2016-05-29 NOTE — ED Triage Notes (Signed)
Pt presents to ED with reports of chest pain for approximately one week that worsened last night. Pt reports pain radiates to her back.

## 2016-05-29 NOTE — ED Notes (Addendum)
NG tube advanced 9 cm. Secured by tape at 59 cm at the nare.

## 2016-05-30 LAB — BASIC METABOLIC PANEL
Anion gap: 5 (ref 5–15)
BUN: 9 mg/dL (ref 6–20)
CALCIUM: 8.9 mg/dL (ref 8.9–10.3)
CHLORIDE: 106 mmol/L (ref 101–111)
CO2: 27 mmol/L (ref 22–32)
CREATININE: 0.84 mg/dL (ref 0.44–1.00)
GFR calc non Af Amer: >60 mL/min (ref >60)
Glucose, Bld: 91 mg/dL (ref 65–99)
Potassium: 4.1 mmol/L (ref 3.5–5.1)
SODIUM: 138 mmol/L (ref 135–145)

## 2016-05-30 LAB — CBC WITH DIFFERENTIAL/PLATELET
BASOS ABS: 0 10*3/uL (ref 0–0.1)
BASOS PCT: 1 %
EOS ABS: 0.3 10*3/uL (ref 0–0.7)
EOS PCT: 4 %
HEMATOCRIT: 35 % (ref 35.0–47.0)
Hemoglobin: 12.6 g/dL (ref 12.0–16.0)
LYMPHS PCT: 42 %
Lymphs Abs: 3.1 10*3/uL (ref 1.0–3.6)
MCH: 33.2 pg (ref 26.0–34.0)
MCHC: 35.9 g/dL (ref 32.0–36.0)
MCV: 92.4 fL (ref 80.0–100.0)
Monocytes Absolute: 0.8 10*3/uL (ref 0.2–0.9)
Monocytes Relative: 11 %
Neutro Abs: 3.1 10*3/uL (ref 1.4–6.5)
Neutrophils Relative %: 42 %
PLATELETS: 182 10*3/uL (ref 150–440)
RBC: 3.79 MIL/uL — ABNORMAL LOW (ref 3.80–5.20)
RDW: 12.2 % (ref 11.5–14.5)
WBC: 7.3 10*3/uL (ref 3.6–11.0)

## 2016-05-30 LAB — MAGNESIUM: MAGNESIUM: 1.9 mg/dL (ref 1.7–2.4)

## 2016-05-30 MED ORDER — LACTATED RINGERS IV SOLN
INTRAVENOUS | Status: DC
  Administered 2016-05-30 – 2016-05-31 (×4): via INTRAVENOUS

## 2016-05-30 MED ORDER — MORPHINE SULFATE (PF) 2 MG/ML IV SOLN
2.0000 mg | INTRAVENOUS | Status: DC | PRN
Start: 2016-05-30 — End: 2016-05-31
  Administered 2016-05-30: 2 mg via INTRAVENOUS
  Filled 2016-05-30: qty 1

## 2016-05-30 MED ORDER — MORPHINE SULFATE (PF) 2 MG/ML IV SOLN
1.0000 mg | Freq: Once | INTRAVENOUS | Status: AC
  Administered 2016-05-30: 1 mg via INTRAVENOUS
  Filled 2016-05-30: qty 1

## 2016-05-30 MED ORDER — PANTOPRAZOLE SODIUM 40 MG PO TBEC
40.0000 mg | DELAYED_RELEASE_TABLET | Freq: Two times a day (BID) | ORAL | Status: DC
  Administered 2016-05-30 – 2016-05-31 (×3): 40 mg via ORAL
  Filled 2016-05-30 (×3): qty 1

## 2016-05-30 MED ORDER — LACTULOSE 10 GM/15ML PO SOLN
30.0000 g | Freq: Every day | ORAL | Status: DC
  Filled 2016-05-30: qty 60

## 2016-05-30 MED ORDER — PANTOPRAZOLE SODIUM 40 MG PO TBEC
40.0000 mg | DELAYED_RELEASE_TABLET | Freq: Every day | ORAL | Status: DC
  Administered 2016-05-30: 40 mg via ORAL
  Filled 2016-05-30: qty 1

## 2016-05-30 MED ORDER — ZOLPIDEM TARTRATE 5 MG PO TABS
5.0000 mg | ORAL_TABLET | Freq: Once | ORAL | Status: AC
  Administered 2016-05-30: 5 mg via ORAL
  Filled 2016-05-30: qty 1

## 2016-05-30 NOTE — Progress Notes (Signed)
45 yr old female with epigastric abdominal pain.  Patient feeling better today.  Full liquid diet was tolerated well.   Vitals:   05/30/16 0506 05/30/16 1317  BP: 112/71 (!) 92/54  Pulse: (!) 58 69  Resp: 18 18  Temp: 97.4 F (36.3 C) 98.1 F (36.7 C)   PE:  Gen: NAD Abd: soft, minimal epigastric tenderness  CBC Latest Ref Rng & Units 05/30/2016 05/29/2016 07/12/2008  WBC 3.6 - 11.0 K/uL 7.3 7.3 11.2(H)  Hemoglobin 12.0 - 16.0 g/dL 40.912.6 81.114.6 9.1(Y8.4(L)  Hematocrit 35.0 - 47.0 % 35.0 41.1 24.4(L)  Platelets 150 - 440 K/uL 182 240 220   CMP Latest Ref Rng & Units 05/30/2016 05/29/2016  Glucose 65 - 99 mg/dL 91 782(N168(H)  BUN 6 - 20 mg/dL 9 10  Creatinine 5.620.44 - 1.00 mg/dL 1.300.84 8.650.83  Sodium 784135 - 145 mmol/L 138 136  Potassium 3.5 - 5.1 mmol/L 4.1 3.8  Chloride 101 - 111 mmol/L 106 102  CO2 22 - 32 mmol/L 27 26  Calcium 8.9 - 10.3 mg/dL 8.9 9.6  Total Protein 6.5 - 8.1 g/dL - 7.8  Total Bilirubin 0.3 - 1.2 mg/dL - 1.1  Alkaline Phos 38 - 126 U/L - 82  AST 15 - 41 U/L - 43(H)  ALT 14 - 54 U/L - 42   A/P:  45 yr old female with epigastric abdominal pain.  She had contrast in the colon on CT, do not believe this is an obstruction. Will give regular diet and see how she does.

## 2016-05-31 LAB — GASTROINTESTINAL PANEL BY PCR, STOOL (REPLACES STOOL CULTURE)

## 2016-05-31 MED ORDER — ZOLPIDEM TARTRATE 5 MG PO TABS
5.0000 mg | ORAL_TABLET | Freq: Every evening | ORAL | Status: DC | PRN
  Administered 2016-05-31: 5 mg via ORAL
  Filled 2016-05-31: qty 1

## 2016-05-31 MED ORDER — PREDNISONE 20 MG PO TABS
20.0000 mg | ORAL_TABLET | Freq: Two times a day (BID) | ORAL | Status: DC
  Administered 2016-05-31: 20 mg via ORAL
  Filled 2016-05-31: qty 1

## 2016-05-31 MED ORDER — HYDROMORPHONE HCL 1 MG/ML IJ SOLN
1.0000 mg | INTRAMUSCULAR | Status: DC | PRN
  Administered 2016-05-31 – 2016-06-01 (×4): 1 mg via INTRAVENOUS
  Filled 2016-05-31 (×4): qty 1

## 2016-05-31 MED ORDER — KETOROLAC TROMETHAMINE 30 MG/ML IJ SOLN: 30.0000 mg | Freq: Four times a day (QID) | INTRAMUSCULAR | Status: DC | PRN

## 2016-05-31 NOTE — H&P (Signed)
[facility] - [place] at [facility]   PATIENT NAME: Terri Sanchez    MR#:  [id number]  DATE OF BIRTH:  November 14, 1970  DATE OF ADMISSION:  05/29/2016  PRIMARY CARE PHYSICIAN: None  REQUESTING/REFERRING PHYSICIAN: Marguerita Merles MD  CHIEF COMPLAINT:   Chief Complaint  Patient presents with  . Chest Pain    HISTORY OF PRESENT ILLNESS: Terri Sanchez  is a 45 y.o. female with a known history of  Endometriosis, multiple GI surgeries as well as Crohn's disease according to patient who presented with 5 days history of abdominal discomfort. She reported that he started with her feeling bloated with her abdomen feeling tighter. She also describes sharp epigastric abdominal pain. And also having nausea. Patient initially reported no stools however she states that she has chronic diarrhea. And has been having diarrhea in the hospital. She was admitted with possible small bowel obstruction. She had a NG tube placed without any output. She was seen by Dr. Orvis Brill today. And did not feel that she had any ongoing surgical issues. Patient continues to have sharp pain in the epigastric region. Associated with nausea. She reports that she is feeling very dizzy and lightheaded and is feeling weak. Denies any chest pain or palpitations complains of radiation to her back. PAST MEDICAL HISTORY:   Past Medical History:  Diagnosis Date  . Bowel obstruction   . Endometriosis     PAST SURGICAL HISTORY: Past Surgical History:  Procedure Laterality Date  . ABDOMINAL HYSTERECTOMY    . CESAREAN SECTION    . CHOLECYSTECTOMY      SOCIAL HISTORY:  Social History  Substance Use Topics  . Smoking status: Former Games developer  . Smokeless tobacco: Never Used     Comment: pt states she vapes  . Alcohol use 1.2 oz/week    2 Glasses of wine per week    FAMILY HISTORY: No family history on file.  DRUG ALLERGIES:  Allergies  Allergen Reactions  . Doxycycline Anaphylaxis    REVIEW OF SYSTEMS:    CONSTITUTIONAL: No fever, fatigue or weakness.  EYES: No blurred or double vision.  EARS, NOSE, AND THROAT: No tinnitus or ear pain.  RESPIRATORY: No cough, shortness of breath, wheezing or hemoptysis.  CARDIOVASCULAR: No chest pain, orthopnea, edema.  GASTROINTESTINAL: Positive nausea, no vomiting, positive diarrhea and positive abdominal pain.  GENITOURINARY: No dysuria, hematuria.  ENDOCRINE: No polyuria, nocturia,  HEMATOLOGY: No anemia, easy bruising or bleeding SKIN: No rash or lesion. MUSCULOSKELETAL: No joint pain or arthritis.   NEUROLOGIC: No tingling, numbness, weakness.  PSYCHIATRY: No anxiety or depression.   MEDICATIONS AT HOME:  Prior to Admission medications   Medication Sig Start Date End Date Taking? Authorizing Provider  acidophilus (RISAQUAD) CAPS capsule Take 1 capsule by mouth daily.   Yes Historical Provider, MD  Multiple Vitamin (MULTIVITAMIN) tablet Take 1 tablet by mouth daily.   Yes Historical Provider, MD  OMEGA-3-ACID ETHYL ESTERS PO Take 1 capsule by mouth daily.   Yes Historical Provider, MD      PHYSICAL EXAMINATION:   VITAL SIGNS: Blood pressure 96/64, pulse (!) 51, temperature 97.5 F (36.4 C), temperature source Oral, resp. rate 18, height 5\' 7"  (1.702 m), weight 141 lb 11.2 oz (64.3 kg), SpO2 98 %.  GENERAL:  45 y.o.-year-old patient lying in the bed with no acute distress.  EYES: Pupils equal, round, reactive to light and accommodation. No scleral icterus. Extraocular muscles intact.  HEENT: Head atraumatic, normocephalic. Oropharynx and nasopharynx clear.  NECK:  Supple, no jugular venous distention. No thyroid enlargement, no tenderness.  LUNGS: Normal breath sounds bilaterally, no wheezing, rales,rhonchi or crepitation. No use of accessory muscles of respiration.  CARDIOVASCULAR: S1, S2 normal. No murmurs, rubs, or gallops.  ABDOMEN: Soft, Epigastric tenderness, nondistended. Bowel sounds hyper No organomegaly or mass.  EXTREMITIES: No  pedal edema, cyanosis, or clubbing.  NEUROLOGIC: Cranial nerves II through XII are intact. Muscle strength 5/5 in all extremities. Sensation intact. Gait not checked.  PSYCHIATRIC: The patient is alert and oriented x 3.  SKIN: No obvious rash, lesion, or ulcer.   LABORATORY PANEL:   CBC  Recent Labs Lab 05/29/16 1113 05/30/16 0558  WBC 7.3 7.3  HGB 14.6 12.6  HCT 41.1 35.0  PLT 240 182  MCV 92.5 92.4  MCH 32.9 33.2  MCHC 35.5 35.9  RDW 12.2 12.2  LYMPHSABS  --  3.1  MONOABS  --  0.8  EOSABS  --  0.3  BASOSABS  --  0.0   ------------------------------------------------------------------------------------------------------------------  Chemistries   Recent Labs Lab 05/29/16 1113 05/30/16 0558  NA 136 138  K 3.8 4.1  CL 102 106  CO2 26 27  GLUCOSE 168* 91  BUN 10 9  CREATININE 0.83 0.84  CALCIUM 9.6 8.9  MG  --  1.9  AST 43*  --   ALT 42  --   ALKPHOS 82  --   BILITOT 1.1  --    ------------------------------------------------------------------------------------------------------------------ estimated creatinine clearance is 81.4 mL/min (by C-G formula based on SCr of 0.84 mg/dL). ------------------------------------------------------------------------------------------------------------------ No results for input(s): TSH, T4TOTAL, T3FREE, THYROIDAB in the last 72 hours.  Invalid input(s): FREET3   Coagulation profile No results for input(s): INR, PROTIME in the last 168 hours. ------------------------------------------------------------------------------------------------------------------- No results for input(s): DDIMER in the last 72 hours. -------------------------------------------------------------------------------------------------------------------  Cardiac Enzymes  Recent Labs Lab 05/29/16 1113  TROPONINI <0.03   ------------------------------------------------------------------------------------------------------------------ Invalid  input(s): POCBNP  ---------------------------------------------------------------------------------------------------------------  Urinalysis No results found for: COLORURINE, APPEARANCEUR, LABSPEC, PHURINE, GLUCOSEU, HGBUR, BILIRUBINUR, KETONESUR, PROTEINUR, UROBILINOGEN, NITRITE, LEUKOCYTESUR   RADIOLOGY: Dg Chest 2 View  Result Date: 05/29/2016 CLINICAL DATA:  Chest pain. EXAM: CHEST  2 VIEW COMPARISON:  No recent prior . FINDINGS: Mediastinum and hilar structures normal. Lungs are clear. No pleural effusion or pneumothorax. Heart size normal. IMPRESSION: No acute cardiopulmonary disease. Electronically Signed   By: Maisie Fushomas  Register   On: 05/29/2016 13:24   Ct Abdomen Pelvis W Contrast  Result Date: 05/29/2016 CLINICAL DATA:  Epigastric abdominal pain. Previous cholecystectomy, hysterectomy, C-section and small bowel obstruction. Ex-smoker. EXAM: CT ABDOMEN AND PELVIS WITH CONTRAST TECHNIQUE: Multidetector CT imaging of the abdomen and pelvis was performed using the standard protocol following bolus administration of intravenous contrast. CONTRAST:  100mL ISOVUE-300 IOPAMIDOL (ISOVUE-300) INJECTION 61% COMPARISON:  06/07/2010. FINDINGS: Lower chest: Bilateral breast implants.  Clear lung bases. Hepatobiliary: Minimal diffuse low density of the liver relative to the spleen. Surgically absent gallbladder. Pancreas: Unremarkable. No pancreatic ductal dilatation or surrounding inflammatory changes. Spleen: Normal in size without focal abnormality. Adrenals/Urinary Tract: Adrenal glands are unremarkable. Kidneys are normal, without renal calculi, focal lesion, or hydronephrosis. Bladder is unremarkable. Stomach/Bowel: Multiple dilated proximal small bowel loops with normal caliber distal small bowel and colon. No obstructing mass seen. Multiple sigmoid colon diverticula. Probable post appendectomy surgical sutures. No evidence of appendicitis. Normal appearing stomach. Vascular/Lymphatic: Minimal aortic  calcification. No enlarged lymph nodes. Reproductive: Surgically absent uterus. Probable small left ovary or ovarian remnant without significant change. No adnexal mass. Other: None. Musculoskeletal: Minimal  lumbar spine degenerative changes. IMPRESSION: 1. Partial small bowel obstruction, most likely due to an adhesion. 2. Sigmoid diverticulosis. 3. Minimal diffuse hepatic steatosis. 4. Minimal aortic atherosclerosis. Electronically Signed   By: Beckie Salts M.D.   On: 05/29/2016 15:06   Dg Abd Portable 1v  Result Date: 05/29/2016 CLINICAL DATA:  Nasogastric tube placement, right-sided abdominal pain for 1 week. EXAM: PORTABLE ABDOMEN - 1 VIEW COMPARISON:  05/29/2016 CT FINDINGS: The tip of a gastric tube is seen 3.4 cm below the left hemidiaphragm. The side-port is still above the diaphragm. Further advancement is recommended at least another 7 cm. Enteric contrast is seen within nonobstructed, nondistended appearing large bowel. Renal collecting system opacification noted from recent CT. No free air. IMPRESSION: Further advancement of a gastric tube is suggested by at least 7 cm. Side port is still above the level of the diaphragm. Electronically Signed   By: Tollie Eth M.D.   On: 05/29/2016 17:46    EKG: Orders placed or performed during the hospital encounter of 05/29/16  . EKG 12-Lead  . EKG 12-Lead  . ED EKG within 10 minutes  . ED EKG within 10 minutes    IMPRESSION AND PLAN: Patient is a 45 year old white female presenting with abdominal pain nausea and diarrhea.  1. Abdominal pain nausea vomiting diarrhea possibly related to enteritis At this point due to her complicated GI history L ask input from a gastroenterologist We will obtain stool studies. Continue supportive care and pain control. Surgery has asked the patient to be transferred to her service which we will accept the patient due to no surgical needs Surgery will continue to follow her while in the hospital Continue IV  fluids Continue H2 blockers I have discontinued the lactulose that she was placed on   2, history of endometriosis continue supportive care  All the records are reviewed and case discussed with ED provider. Management plans discussed with the patient, family and they are in agreement.  CODE STATUS:    Code Status Orders        Start     Ordered   05/29/16 2049  Full code  Continuous     05/29/16 2051    Code Status History    Date Active Date Inactive Code Status Order ID Comments User Context   This patient has a current code status but no historical code status.       TOTAL TIME TAKING CARE OF THIS PATIENT: 45 minutes.    Auburn Bilberry M.D on 05/31/2016 at 11:13 AM  Between 7am to 6pm - Pager - [phone]  After 6pm go to [email or web address] - password [email or web address] [facility]  [facility]  Office  [phone]  CC: Primary care physician; No primary care provider on file.

## 2016-05-31 NOTE — Consult Note (Signed)
GI Inpatient Consult Note  Reason for Consult:recurrent small bowel obstructions and Hx of  Crohn's disease.   Attending Requesting Consult:Shreyang Patel  History of Present Illness: Terri Sanchez is a 45 y.o. 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  She has also had GB, appendix, (ruptured) and hysterectomy as well as numerous laproscopic exams.  She came in to hospital with bloating, tightness, and sharp epigastric pain which has responded to NG tube suction so that the NG tube has been removed and she has been started on food.  [place] GI doctor was in [place] and [place] moved to [place] and now resides in [place] again.  She is currently on no medicine for Crohn's disease, takes probiotics, aloe vera.  Still significant pain in upper epigastric area.  Past Medical History:  Past Medical History:  Diagnosis Date  . Bowel obstruction   . Endometriosis     Problem List: Patient Active Problem List   Diagnosis Date Noted  . Small bowel obstruction due to adhesions 05/29/2016  . Small bowel obstruction 05/29/2016    Past Surgical History: Past Surgical History:  Procedure Laterality Date  . ABDOMINAL HYSTERECTOMY    . CESAREAN SECTION    . CHOLECYSTECTOMY      Allergies: Allergies  Allergen Reactions  . Doxycycline Anaphylaxis    Home Medications: Prescriptions Prior to Admission  Medication Sig Dispense Refill Last Dose  . acidophilus (RISAQUAD) CAPS capsule Take 1 capsule by mouth daily.   Past Week at Unknown time  . Multiple Vitamin (MULTIVITAMIN) tablet Take 1 tablet by mouth daily.   Past Week at Unknown time  . OMEGA-3-ACID ETHYL ESTERS PO Take 1 capsule by mouth daily.   Past Week at Unknown time   Home medication reconciliation was completed with the patient.   Scheduled Inpatient Medications:   . pantoprazole  40 mg Oral BID    Continuous Inpatient Infusions:   . lactated  ringers 75 mL/hr at 05/31/16 0610    PRN Inpatient Medications:  HYDROmorphone (DILAUDID) injection, [START ON 06/04/2016] ketorolac, ondansetron **OR** ondansetron (ZOFRAN) IV, phenol  Family History: family history is not on file.  The patient's family history is negative for inflammatory bowel disorders, GI malignancy, or solid organ transplantation.  Social History:   reports that she has quit smoking. She has never used smokeless tobacco. She reports that she drinks about 1.2 oz of alcohol per week . She reports that she does not use drugs. The patient denies ETOH, tobacco, or drug use.   Review of Systems: Constitutional: Weight is stable.  Eyes: No changes in vision. ENT: No oral lesions, sore throat.  GI: see HPI.  Heme/Lymph: No easy bruising.  CV: No chest pain.  GU: No hematuria.  Integumentary: No rashes.  Neuro: No headaches.  Psych: No depression/anxiety.  Endocrine: No heat/cold intolerance.  Allergic/Immunologic: No urticaria.  Resp: No cough, SOB.  Musculoskeletal: No joint swelling.    Physical Examination: BP 125/78 (BP Location: Right Arm)   Pulse 75   Temp 98.3 F (36.8 C) (Oral)   Resp 16   Ht 5\' 7"  (1.702 m)   Wt 64.3 kg (141 lb 11.2 oz)   SpO2 98%   BMI 22.19 kg/m  Gen: NAD, alert and oriented x 4 HEENT: PEERLA, EOMI, Neck: supple, no JVD or thyromegaly Chest: CTA bilaterally, no wheezes, crackles, or other adventitious sounds CV: RRR, no m/g/c/r Abd: soft, NT, ND, +BS in all  four quadrants; no HSM, guarding, ridigity, or rebound tenderness, no masses, diffuse mild tenderness. Ext: no edema, well perfused with 2+ pulses, Skin: no rash or lesions noted Lymph: no LAD  Data: Lab Results  Component Value Date   WBC 7.3 05/30/2016   HGB 12.6 05/30/2016   HCT 35.0 05/30/2016   MCV 92.4 05/30/2016   PLT 182 05/30/2016    Recent Labs Lab 05/29/16 1113 05/30/16 0558  HGB 14.6 12.6   Lab Results  Component Value Date   NA 138 05/30/2016    K 4.1 05/30/2016   CL 106 05/30/2016   CO2 27 05/30/2016   BUN 9 05/30/2016   CREATININE 0.84 05/30/2016   Lab Results  Component Value Date   ALT 42 05/29/2016   AST 43 (H) 05/29/2016   ALKPHOS 82 05/29/2016   BILITOT 1.1 05/29/2016   No results for input(s): APTT, INR, PTT in the last 168 hours. Assessment/Plan: Ms. Terri Sanchez is a 45 y.o. female with recent small bowel partial obstruction now resolved with hx of recurrent SBO and possible Crohn's disease versus similar problems from adhesions.  Will do EGD tomorrow  Recommendations:  Thank you for the consult. Please call with questions or concerns.  Lynnae Prude, MD

## 2016-05-31 NOTE — Progress Notes (Signed)
45 yr old female with epigastric abdominal pain.  Patient ate some regular food yesterday.  She did have one episode of emesis.  She did well with the grits this AM but is still having crampy epigastric pain.  She has reportedly been having diarrhea stools as well.    Vitals:   05/31/16 0450 05/31/16 0519  BP: (!) 78/49 96/64  Pulse: (!) 51   Resp: 18   Temp:     PE:  Gen: NAD Abd: soft, minimal epigastric tenderness  CBC Latest Ref Rng & Units 05/30/2016 05/29/2016 07/12/2008  WBC 3.6 - 11.0 K/uL 7.3 7.3 11.2(H)  Hemoglobin 12.0 - 16.0 g/dL 16.112.6 09.614.6 0.4(V8.4(L)  Hematocrit 35.0 - 47.0 % 35.0 41.1 24.4(L)  Platelets 150 - 440 K/uL 182 240 220   CMP Latest Ref Rng & Units 05/30/2016 05/29/2016  Glucose 65 - 99 mg/dL 91 409(W168(H)  BUN 6 - 20 mg/dL 9 10  Creatinine 1.190.44 - 1.00 mg/dL 1.470.84 8.290.83  Sodium 562135 - 145 mmol/L 138 136  Potassium 3.5 - 5.1 mmol/L 4.1 3.8  Chloride 101 - 111 mmol/L 106 102  CO2 22 - 32 mmol/L 27 26  Calcium 8.9 - 10.3 mg/dL 8.9 9.6  Total Protein 6.5 - 8.1 g/dL - 7.8  Total Bilirubin 0.3 - 1.2 mg/dL - 1.1  Alkaline Phos 38 - 126 U/L - 82  AST 15 - 41 U/L - 43(H)  ALT 14 - 54 U/L - 42   A/P:  45 yr old female with epigastric abdominal pain likely gastroenteritis.  She does not have an obstruction as her abdomen is soft, CT scan with contrast to colon and having diarrhea.  I discussed with Dr. Allena KatzPatel of internal medicine to evaluate for gastroenteritis, he agrees that this is likely.  He graciously agreed to accept her to the medicine service as she does not have any surgical issues at this time.  He is getting a GI consult given her complex history.  Surgery will continue to follow.

## 2016-06-01 ENCOUNTER — Inpatient Hospital Stay: Payer: BLUE CROSS/BLUE SHIELD | Admitting: Anesthesiology

## 2016-06-01 ENCOUNTER — Encounter: Payer: Self-pay | Admitting: Anesthesiology

## 2016-06-01 ENCOUNTER — Encounter
Admission: EM | Disposition: A | Discharge status: Home / Self Care | Payer: Self-pay | Source: Home / Self Care | Attending: Surgery

## 2016-06-01 HISTORY — PX: ESOPHAGOGASTRODUODENOSCOPY: SHX5428

## 2016-06-01 LAB — CBC
HEMATOCRIT: 33.8 % — AB (ref 35.0–47.0)
Hemoglobin: 12 g/dL (ref 12.0–16.0)
MCH: 32.7 pg (ref 26.0–34.0)
MCHC: 35.6 g/dL (ref 32.0–36.0)
MCV: 91.9 fL (ref 80.0–100.0)
PLATELETS: 203 10*3/uL (ref 150–440)
RBC: 3.68 MIL/uL — AB (ref 3.80–5.20)
RDW: 11.6 % (ref 11.5–14.5)
WBC: 6.8 10*3/uL (ref 3.6–11.0)

## 2016-06-01 LAB — BASIC METABOLIC PANEL
ANION GAP: 7 (ref 5–15)
BUN: 6 mg/dL (ref 6–20)
CHLORIDE: 106 mmol/L (ref 101–111)
CO2: 27 mmol/L (ref 22–32)
Calcium: 9.1 mg/dL (ref 8.9–10.3)
Creatinine, Ser: 0.64 mg/dL (ref 0.44–1.00)
GFR calc non Af Amer: >60 mL/min (ref >60)
Glucose, Bld: 122 mg/dL — ABNORMAL HIGH (ref 65–99)
POTASSIUM: 3.7 mmol/L (ref 3.5–5.1)
SODIUM: 140 mmol/L (ref 135–145)

## 2016-06-01 SURGERY — EGD (ESOPHAGOGASTRODUODENOSCOPY)
Anesthesia: General

## 2016-06-01 MED ORDER — SUCRALFATE 1 GM/10ML PO SUSP: 1.0000 g | Freq: Four times a day (QID) | ORAL | 0 refills | Status: DC

## 2016-06-01 MED ORDER — MIDAZOLAM HCL 5 MG/5ML IJ SOLN
INTRAMUSCULAR | Status: DC | PRN
  Administered 2016-06-01: 2 mg via INTRAVENOUS

## 2016-06-01 MED ORDER — PROPOFOL 10 MG/ML IV BOLUS
INTRAVENOUS | Status: DC | PRN
  Administered 2016-06-01: 70 mg via INTRAVENOUS

## 2016-06-01 MED ORDER — LACTATED RINGERS IV SOLN
INTRAVENOUS | Status: DC | PRN
  Administered 2016-06-01: 09:00:00 via INTRAVENOUS

## 2016-06-01 MED ORDER — PANTOPRAZOLE SODIUM 40 MG PO TBEC: 40.0000 mg | DELAYED_RELEASE_TABLET | Freq: Two times a day (BID) | ORAL | 1 refills | Status: AC

## 2016-06-01 MED ORDER — LIDOCAINE 2% (20 MG/ML) 5 ML SYRINGE
INTRAMUSCULAR | Status: DC | PRN
  Administered 2016-06-01: 50 mg via INTRAVENOUS

## 2016-06-01 MED ORDER — SUCRALFATE 1 GM/10ML PO SUSP
1.0000 g | Freq: Four times a day (QID) | ORAL | Status: DC
  Administered 2016-06-01: 1 g via ORAL
  Filled 2016-06-01 (×3): qty 10

## 2016-06-01 MED ORDER — PROPOFOL 500 MG/50ML IV EMUL
INTRAVENOUS | Status: DC | PRN
  Administered 2016-06-01: 140 ug/kg/min via INTRAVENOUS

## 2016-06-01 MED ORDER — GLYCOPYRROLATE 0.2 MG/ML IJ SOLN
INTRAMUSCULAR | Status: DC | PRN
  Administered 2016-06-01: 0.2 mg via INTRAVENOUS

## 2016-06-01 NOTE — Discharge Instructions (Signed)
Pt to get PCP in the area

## 2016-06-01 NOTE — Progress Notes (Signed)
06/01/2016 2:19 PM  Terri KeenAngela D Holster to be D/C'd Home per MD order.  Discussed prescriptions and follow up appointments with the patient. Prescriptions given to patient, medication list explained in detail. Pt verbalized understanding.    Medication List    TAKE these medications   acidophilus Caps capsule Take 1 capsule by mouth daily.   multivitamin tablet Take 1 tablet by mouth daily.   OMEGA-3-ACID ETHYL ESTERS PO Take 1 capsule by mouth daily.   pantoprazole 40 MG tablet Commonly known as:  PROTONIX Take 1 tablet (40 mg total) by mouth 2 (two) times daily.   sucralfate 1 GM/10ML suspension Commonly known as:  CARAFATE Take 10 mLs (1 g total) by mouth every 6 (six) hours.       Vitals:   06/01/16 0931 06/01/16 1001  BP: 90/70 131/80  Pulse:  (!) 55  Resp: 16 17  Temp:  97.5 F (36.4 C)    Skin clean, dry and intact without evidence of skin break down, no evidence of skin tears noted. IV catheter discontinued intact. Site without signs and symptoms of complications. Dressing and pressure applied. Pt denies pain at this time. No complaints noted.  An After Visit Summary was printed and given to the patient. Patient escorted and D/C home via private auto.  Bradly Chrisougherty, [name]

## 2016-06-01 NOTE — Transfer of Care (Signed)
Immediate Anesthesia Transfer of Care Note  Patient: Terri Sanchez  Procedure(s) Performed: Procedure(s): ESOPHAGOGASTRODUODENOSCOPY (EGD) (N/A)  Patient Location: Endoscopy Unit  Anesthesia Type:General  Level of Consciousness: awake and alert   Airway & Oxygen Therapy: Patient Spontanous Breathing  Post-op Assessment: Report given to RN and Post -op Vital signs reviewed and stable  Post vital signs: Reviewed  Last Vitals:  Vitals:   06/01/16 0839 06/01/16 0919  BP: 123/85 119/65  Pulse: (!) 56 79  Resp: 16 18  Temp: (!) 35.8 C 36.4 C    Last Pain:  Vitals:   06/01/16 0839  TempSrc: Tympanic  PainSc:       Patients Stated Pain Goal: 1 (05/31/16 1647)  Complications: No apparent anesthesia complications

## 2016-06-01 NOTE — Consult Note (Signed)
EGD showed mild antral gastritis, minimal duodenitis and a very small hiatal hernia.  I have discontinued the Toradol as it is a strong NSAID and can cause signif GI tract mucosal damage in some patients.

## 2016-06-01 NOTE — Anesthesia Preprocedure Evaluation (Signed)
Anesthesia Evaluation  Patient identified by MRN, date of birth, ID band Patient awake    Reviewed: Allergy & Precautions, NPO status , Patient's Chart, lab work & pertinent test results  Airway Mallampati: II       Dental   Pulmonary former smoker,    Pulmonary exam normal        Cardiovascular negative cardio ROS Normal cardiovascular exam     Neuro/Psych negative neurological ROS  negative psych ROS   GI/Hepatic Neg liver ROS, Hx of bowel obstruction   Endo/Other  negative endocrine ROS  Renal/GU negative Renal ROS  Female GU complaint     Musculoskeletal negative musculoskeletal ROS (+)   Abdominal Normal abdominal exam  (+)   Peds  Hematology negative hematology ROS (+)   Anesthesia Other Findings Hx of bowel obstruction endometriosis  Reproductive/Obstetrics                             Anesthesia Physical Anesthesia Plan  ASA: II and emergent  Anesthesia Plan: General   Post-op Pain Management:    Induction: Intravenous  Airway Management Planned: Nasal Cannula  Additional Equipment:   Intra-op Plan:   Post-operative Plan:   Informed Consent: I have reviewed the patients History and Physical, chart, labs and discussed the procedure including the risks, benefits and alternatives for the proposed anesthesia with the patient or authorized representative who has indicated his/her understanding and acceptance.   Dental advisory given  Plan Discussed with: CRNA and Surgeon  Anesthesia Plan Comments:         Anesthesia Quick Evaluation

## 2016-06-01 NOTE — Op Note (Signed)
[facility] Gastroenterology Patient Name: Terri Sanchez Procedure Date: 06/01/2016 8:33 AM MRN: [id number] Account #: [id number] Date of Birth: 09/18/1970 Admit Type: Inpatient Age: 45 [facility] ENDO ROOM 4 Gender: Female Note Status: Finalized Procedure:            Upper GI endoscopy Indications:          Epigastric abdominal pain Providers:            Scot Junobert T. Kinney Sackmann, MD Medicines:            Propofol per Anesthesia Complications:        No immediate complications. Procedure:            Pre-Anesthesia Assessment:                       - After reviewing the risks and benefits, the patient                        was deemed in satisfactory condition to undergo the                        procedure.                       After obtaining informed consent, the endoscope was                        passed under direct vision. Throughout the procedure,                        the patient's blood pressure, pulse, and oxygen                        saturations were monitored continuously. The Endoscope                        was introduced through the mouth, and advanced to the                        second part of duodenum. The upper GI endoscopy was                        accomplished without difficulty. The patient tolerated                        the procedure well. Findings:      A very small hiatal hernia was present.      Patchy minimal inflammation characterized by erythema and granularity       was found in the gastric antrum. Biopsies were taken with a cold forceps       for histology. Biopsies were taken with a cold forceps for Helicobacter       pylori testing.      The examined duodenum was normal. Impression:           - Small hiatal hernia.                       - Gastritis. Biopsied.                       - Normal examined duodenum. Recommendation:       - Await  pathology results. Carafate 3-4 times a day.                        PPI twice a day for 8  weeks then once a day. Low                        roughage diet. Scot Jun, MD 06/01/2016 9:16:49 AM This report has been signed electronically. Number of Addenda: 0 Note Initiated On: 06/01/2016 8:33 AM      [facility]

## 2016-06-01 NOTE — Anesthesia Postprocedure Evaluation (Signed)
Anesthesia Post Note  Patient: Greggory Keenngela D Susi  Procedure(s) Performed: Procedure(s) (LRB): ESOPHAGOGASTRODUODENOSCOPY (EGD) (N/A)  Patient location during evaluation: PACU Anesthesia Type: General Level of consciousness: awake and alert and oriented Pain management: pain level controlled Vital Signs Assessment: post-procedure vital signs reviewed and stable Respiratory status: spontaneous breathing Cardiovascular status: blood pressure returned to baseline Anesthetic complications: no    Last Vitals:  Vitals:   06/01/16 0931 06/01/16 1001  BP: 90/70 131/80  Pulse:  (!) 55  Resp: 16 17  Temp:  36.4 C    Last Pain:  Vitals:   06/01/16 1049  TempSrc:   PainSc: 4                  Brooklin Rieger

## 2016-06-01 NOTE — Discharge Summary (Signed)
[facility] [place] [facility]   PATIENT NAME: Terri Sanchez    MR#:  [id number]  DATE OF BIRTH:  03/31/1971  DATE OF ADMISSION:  05/29/2016 ADMITTING PHYSICIAN: Henrene DodgeJose Piscoya, MD  DATE OF DISCHARGE: 06/01/16  PRIMARY CARE PHYSICIAN: No primary care provider on file.    ADMISSION DIAGNOSIS:  Small bowel obstruction [K56.609] Encounter for imaging study to confirm nasogastric (NG) tube placement [Z01.89]  DISCHARGE DIAGNOSIS:  Acute Gastritis  SECONDARY DIAGNOSIS:   Past Medical History:  Diagnosis Date  . Bowel obstruction   . Endometriosis     HOSPITAL COURSE:  45 year old white female presenting with abdominal pain nausea and diarrhea.  1. Abdominal pain nausea vomiting diarrhea possibly related to gastritis Seen by GI and EGD showed gastritis ppi and carafate Continue supportive care and pain control. Continue H2 blockers Tolerating po diet No more diarrhea and GI panel negative -d/c po steroids (pt having skin break out onf the face and prefers not to take it)  2, history of endometriosis continue supportive care  3.h/o crohnz with chronic diarrhea  D/w pt and husband D/chome D/w surgery  CONSULTS OBTAINED:  Treatment Team:  Gladis Riffleatherine L Loflin, MD Scot Junobert T Elliott, MD  DRUG ALLERGIES:   Allergies  Allergen Reactions  . Doxycycline Anaphylaxis    DISCHARGE MEDICATIONS:   Current Discharge Medication List    START taking these medications   Details  pantoprazole (PROTONIX) 40 MG tablet Take 1 tablet (40 mg total) by mouth 2 (two) times daily. Qty: 60 tablet, Refills: 1    sucralfate (CARAFATE) 1 GM/10ML suspension Take 10 mLs (1 g total) by mouth every 6 (six) hours. Qty: 420 mL, Refills: 0      CONTINUE these medications which have NOT CHANGED   Details  acidophilus (RISAQUAD) CAPS capsule Take 1 capsule by mouth daily.    Multiple Vitamin (MULTIVITAMIN) tablet Take 1 tablet by mouth daily.     OMEGA-3-ACID ETHYL ESTERS PO Take 1 capsule by mouth daily.        If you experience worsening of your admission symptoms, develop shortness of breath, life threatening emergency, suicidal or homicidal thoughts you must seek medical attention immediately by calling 911 or calling your MD immediately  if symptoms less severe.  You Must read complete instructions/literature along with all the possible adverse reactions/side effects for all the Medicines you take and that have been prescribed to you. Take any new Medicines after you have completely understood and accept all the possible adverse reactions/side effects.   Please note  You were cared for by a hospitalist during your hospital stay. If you have any questions about your discharge medications or the care you received while you were in the hospital after you are discharged, you can call the unit and asked to speak with the hospitalist on call if the hospitalist that took care of you is not available. Once you are discharged, your primary care physician will handle any further medical issues. Please note that NO REFILLS for any discharge medications will be authorized once you are discharged, as it is imperative that you return to your primary care physician (or establish a relationship with a primary care physician if you do not have one) for your aftercare needs so that they can reassess your need for medications and monitor your lab values. Today   SUBJECTIVE   No new complaints  VITAL SIGNS:  Blood pressure 131/80, pulse (!) 55, temperature 97.5 F (36.4 C), temperature source  Oral, resp. rate 17, height 5\' 7"  (1.702 m), weight 64.3 kg (141 lb 11.2 oz), SpO2 100 %.  I/O:   Intake/Output Summary (Last 24 hours) at 06/01/16 1245 Last data filed at 06/01/16 0915  Gross per 24 hour  Intake             2176 ml  Output             2100 ml  Net               76 ml    PHYSICAL EXAMINATION:  GENERAL:  45 y.o.-year-old patient lying  in the bed with no acute distress.  EYES: Pupils equal, round, reactive to light and accommodation. No scleral icterus. Extraocular muscles intact.  HEENT: Head atraumatic, normocephalic. Oropharynx and nasopharynx clear.  NECK:  Supple, no jugular venous distention. No thyroid enlargement, no tenderness.  LUNGS: Normal breath sounds bilaterally, no wheezing, rales,rhonchi or crepitation. No use of accessory muscles of respiration.  CARDIOVASCULAR: S1, S2 normal. No murmurs, rubs, or gallops.  ABDOMEN: Soft, non-tender, non-distended. Bowel sounds present. No organomegaly or mass.  EXTREMITIES: No pedal edema, cyanosis, or clubbing.  NEUROLOGIC: Cranial nerves II through XII are intact. Muscle strength 5/5 in all extremities. Sensation intact. Gait not checked.  PSYCHIATRIC: The patient is alert and oriented x 3.  SKIN: No obvious rash, lesion, or ulcer.   DATA REVIEW:   CBC   Recent Labs Lab 06/01/16 0534  WBC 6.8  HGB 12.0  HCT 33.8*  PLT 203    Chemistries   Recent Labs Lab 05/29/16 1113 05/30/16 0558 06/01/16 0534  NA 136 138 140  K 3.8 4.1 3.7  CL 102 106 106  CO2 26 27 27   GLUCOSE 168* 91 122*  BUN 10 9 6   CREATININE 0.83 0.84 0.64  CALCIUM 9.6 8.9 9.1  MG  --  1.9  --   AST 43*  --   --   ALT 42  --   --   ALKPHOS 82  --   --   BILITOT 1.1  --   --     Microbiology Results   Recent Results (from the past 240 hour(s))  Gastrointestinal Panel by PCR , Stool     Status: None   Collection Time: 05/31/16 12:55 PM  Result Value Ref Range Status   Campylobacter species NOT DETECTED NOT DETECTED Final   Plesimonas shigelloides NOT DETECTED NOT DETECTED Final   Salmonella species NOT DETECTED NOT DETECTED Final   Yersinia enterocolitica NOT DETECTED NOT DETECTED Final   Vibrio species NOT DETECTED NOT DETECTED Final   Vibrio cholerae NOT DETECTED NOT DETECTED Final   Enteroaggregative E coli (EAEC) NOT DETECTED NOT DETECTED Final   Enteropathogenic E coli  (EPEC) NOT DETECTED NOT DETECTED Final   Enterotoxigenic E coli (ETEC) NOT DETECTED NOT DETECTED Final   Shiga like toxin producing E coli (STEC) NOT DETECTED NOT DETECTED Final   Shigella/Enteroinvasive E coli (EIEC) NOT DETECTED NOT DETECTED Final   Cryptosporidium NOT DETECTED NOT DETECTED Final   Cyclospora cayetanensis NOT DETECTED NOT DETECTED Final   Entamoeba histolytica NOT DETECTED NOT DETECTED Final   Giardia lamblia NOT DETECTED NOT DETECTED Final   Adenovirus F40/41 NOT DETECTED NOT DETECTED Final   Astrovirus NOT DETECTED NOT DETECTED Final   Norovirus GI/GII NOT DETECTED NOT DETECTED Final   Rotavirus A NOT DETECTED NOT DETECTED Final   Sapovirus (I, II, IV, and V) NOT DETECTED NOT DETECTED Final  RADIOLOGY:  No results found.   Management plans discussed with the patient, family and they are in agreement.  CODE STATUS:     Code Status Orders        Start     Ordered   05/29/16 2049  Full code  Continuous     05/29/16 2051    Code Status History    Date Active Date Inactive Code Status Order ID Comments User Context   This patient has a current code status but no historical code status.      TOTAL TIME TAKING CARE OF THIS PATIENT: 40 minutes.    Joscelin Fray M.D on 06/01/2016 at 12:45 PM  Between 7am to 6pm - Pager - [phone] After 6pm go to [email or web address] - password [email or web address]  [facility]  Office  [phone]  CC: Primary care physician; No primary care provider on file.

## 2016-06-02 ENCOUNTER — Encounter: Payer: Self-pay | Admitting: Unknown Physician Specialty

## 2016-06-04 LAB — SURGICAL PATHOLOGY

## 2016-07-24 ENCOUNTER — Other Ambulatory Visit: Payer: Self-pay | Admitting: Obstetrics and Gynecology

## 2016-07-24 DIAGNOSIS — N805 Endometriosis of intestine, unspecified: Secondary | ICD-10-CM | POA: Insufficient documentation

## 2016-07-24 DIAGNOSIS — Z1231 Encounter for screening mammogram for malignant neoplasm of breast: Secondary | ICD-10-CM

## 2016-07-24 DIAGNOSIS — R232 Flushing: Secondary | ICD-10-CM | POA: Insufficient documentation

## 2016-07-29 DIAGNOSIS — K2970 Gastritis, unspecified, without bleeding: Secondary | ICD-10-CM | POA: Insufficient documentation

## 2016-07-29 DIAGNOSIS — K529 Noninfective gastroenteritis and colitis, unspecified: Secondary | ICD-10-CM | POA: Insufficient documentation

## 2016-07-30 ENCOUNTER — Ambulatory Visit: Admission: RE | Admit: 2016-07-30 | Payer: BLUE CROSS/BLUE SHIELD | Source: Ambulatory Visit

## 2016-08-08 ENCOUNTER — Ambulatory Visit: Admission: RE | Admit: 2016-08-08 | Payer: BLUE CROSS/BLUE SHIELD | Source: Ambulatory Visit

## 2016-08-28 ENCOUNTER — Ambulatory Visit
Admission: RE | Admit: 2016-08-28 | Discharge: 2016-08-28 | Disposition: A | Discharge status: Home / Self Care | Payer: BLUE CROSS/BLUE SHIELD | Source: Ambulatory Visit | Attending: Obstetrics and Gynecology | Admitting: Obstetrics and Gynecology

## 2016-08-28 DIAGNOSIS — Z1231 Encounter for screening mammogram for malignant neoplasm of breast: Secondary | ICD-10-CM | POA: Insufficient documentation

## 2016-09-05 ENCOUNTER — Other Ambulatory Visit: Payer: Self-pay | Admitting: *Deleted

## 2016-09-05 ENCOUNTER — Inpatient Hospital Stay
Admission: RE | Admit: 2016-09-05 | Discharge: 2016-09-05 | Disposition: A | Discharge status: Home / Self Care | Payer: Self-pay | Source: Ambulatory Visit | Attending: *Deleted | Admitting: *Deleted

## 2016-09-05 DIAGNOSIS — Z9289 Personal history of other medical treatment: Secondary | ICD-10-CM

## 2017-10-08 ENCOUNTER — Other Ambulatory Visit: Payer: Self-pay | Admitting: Obstetrics and Gynecology

## 2017-10-08 DIAGNOSIS — Z8742 Personal history of other diseases of the female genital tract: Secondary | ICD-10-CM

## 2017-10-08 DIAGNOSIS — K5080 Crohn's disease of both small and large intestine without complications: Secondary | ICD-10-CM

## 2017-10-08 DIAGNOSIS — Z01419 Encounter for gynecological examination (general) (routine) without abnormal findings: Secondary | ICD-10-CM

## 2017-10-08 DIAGNOSIS — R1031 Right lower quadrant pain: Secondary | ICD-10-CM

## 2017-10-09 ENCOUNTER — Other Ambulatory Visit: Payer: Self-pay | Admitting: Obstetrics and Gynecology

## 2017-10-09 DIAGNOSIS — Z1231 Encounter for screening mammogram for malignant neoplasm of breast: Secondary | ICD-10-CM

## 2017-10-12 ENCOUNTER — Ambulatory Visit: Payer: BLUE CROSS/BLUE SHIELD

## 2017-10-16 DIAGNOSIS — E782 Mixed hyperlipidemia: Secondary | ICD-10-CM | POA: Insufficient documentation

## 2017-10-16 DIAGNOSIS — I10 Essential (primary) hypertension: Secondary | ICD-10-CM | POA: Insufficient documentation

## 2017-10-16 DIAGNOSIS — G4733 Obstructive sleep apnea (adult) (pediatric): Secondary | ICD-10-CM | POA: Insufficient documentation

## 2017-10-20 ENCOUNTER — Ambulatory Visit: Payer: 59

## 2017-10-28 ENCOUNTER — Ambulatory Visit: Payer: 59 | Attending: Neurology

## 2017-10-28 DIAGNOSIS — G4733 Obstructive sleep apnea (adult) (pediatric): Secondary | ICD-10-CM | POA: Diagnosis present

## 2017-10-28 DIAGNOSIS — G4761 Periodic limb movement disorder: Secondary | ICD-10-CM | POA: Diagnosis not present

## 2018-02-23 ENCOUNTER — Ambulatory Visit (INDEPENDENT_AMBULATORY_CARE_PROVIDER_SITE_OTHER): Payer: 59 | Admitting: Internal Medicine

## 2018-02-23 ENCOUNTER — Encounter: Payer: Self-pay | Admitting: Internal Medicine

## 2018-02-23 VITALS — BP 92/56 | HR 68 | Ht 67.0 in | Wt 151.0 lb

## 2018-02-23 DIAGNOSIS — R0600 Dyspnea, unspecified: Secondary | ICD-10-CM

## 2018-02-23 MED ORDER — ALBUTEROL SULFATE HFA 108 (90 BASE) MCG/ACT IN AERS: 1.0000 | INHALATION_SPRAY | Freq: Four times a day (QID) | RESPIRATORY_TRACT | 5 refills | Status: AC | PRN

## 2018-02-23 NOTE — Patient Instructions (Addendum)
You should stop vaping as this may contribute to trouble breathing.  Will start an inhaler, will check a pulmonary function test, and will need to obtain a download from your machine.   Please obtain a copy of [facility] CT [facility] on [facility] for me to review.

## 2018-02-23 NOTE — Progress Notes (Signed)
[facility] Pulmonary Medicine Consultation      Assessment and Plan:  Obstructive sleep apnea. -Known obstructive sleep apnea, currently on BiPAP with continued symptoms of excessive daytime sleepiness.  Did not tolerate the initial high pressure of 19/15, then decrease to 12/5, then 10/8. - We will need to obtain download on current device, patient may benefit from switch to a BiPAP auto versus auto CPAP.  Dyspnea on exertion and at rest. -Uncertain as to why she is developing dyspnea which is atypical, comes on at rest or with activity. - I have asked her to stop vaping, in addition I am giving her an albuterol inhaler to be used as needed for dyspnea.  Nicotine abuse. - Currently vaping, discussed the importance of quitting.  Paresthesias in arms - Paresthesias in arms which comes on occasionally, as well as a right flank pain.  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  Crohn's disease. -Patient has a history of Crohn's disease, currently controlled without medications.  Orders Placed This Encounter  Procedures  . Pulmonary Function Test [facility] Only   Meds ordered this encounter  Medications  . albuterol (PROAIR HFA) 108 (90 Base) MCG/ACT inhaler    Sig: Inhale 1-2 puffs into the lungs every 6 (six) hours as needed for wheezing or shortness of breath.    Dispense:  1 Inhaler    Refill:  5   Return in about 6 weeks (around 04/06/2018).    Date: 02/23/2018  MRN# [id number] [name] [birth year]  Referring Physician: Dr. Gwen Pounds.   Terri Sanchez is a 47 y.o. old female seen in consultation for chief complaint of:    Chief Complaint  Patient presents with  . Consult    wears BiPAP; restless sleep:    HPI:  She had been having issues with excessive daytime sleepiness, she was sent for a sleep study in Jan of this year which showed OSA. She sees was sent for a CPAP titration on 10/28/17 and placed on bipap.    Recently she was having back pain overnight which was progressive, she had a CT scan in the ED which showed some mild atelectasis.   She continues to have pain in her right flank region, tingling in her arm, dyspnea on exertion, and continues to wake with headaches. She has been on bipap for about 3 months and the bipap seems to help a bit but not completely.  She notes that occasionally she can not catch her breath, it can happen at rest and with activity, she has difficulty describing it. She has never been diagnosed with respiratory problems. Her last smoked cigarettes was 10 yrs ago, she continues to vape at night with a glass of wine.  She denies reflux, or sinus drainage, she has 2 cats, not in bedroom.  She has been tested for allergies remotely, does not recall allergies.  No recent travel. 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  She is no longer snoring, she is still somewhat sleepy during the day, but not as much. Her original setting felt too much and could not be tolerated, so was reduced to 12/8; then lowered to 10/8.   **CTAP 01/05/18 (report); miminal bibasilar atelectasis.  **CPAP titration 10/28/2017>> PLM arousal index of 2.1.  Recommended to be on BiPAP "15/19".  Review of raw data shows AHI improved to below 15 with CPAP pressure of 12.  Supine REM was not achieved.  The patient was switched to BiPAP 18/14 with AHI of 0 and changed to BiPAP 19/15   PMHX:   Past Medical History:  Diagnosis Date  . Bowel obstruction (HCC)   . Endometriosis    Surgical Hx:  Past Surgical History:  Procedure Laterality Date  . ABDOMINAL HYSTERECTOMY    . AUGMENTATION MAMMAPLASTY Bilateral 2011  . CESAREAN SECTION    . CHOLECYSTECTOMY    . ESOPHAGOGASTRODUODENOSCOPY N/A 06/01/2016   Procedure: ESOPHAGOGASTRODUODENOSCOPY (EGD);  Surgeon: Scot Jun, MD;  Location: [facility] ENDOSCOPY;  Service: Endoscopy;  Laterality: N/A;   Family Hx:  Family History  Problem Relation Age of  Onset  . Breast cancer Neg Hx    Social Hx:   Social History   Tobacco Use  . Smoking status: Former Games developer  . Smokeless tobacco: Never Used  . Tobacco comment: pt states she vapes  Substance Use Topics  . Alcohol use: Yes    Alcohol/week: 1.2 oz    Types: 2 Glasses of wine per week  . Drug use: No   Medication:    Current Outpatient Medications:  .  acidophilus (RISAQUAD) CAPS capsule, Take 1 capsule by mouth daily., Disp: , Rfl:  .  Multiple Vitamin (MULTIVITAMIN) tablet, Take 1 tablet by mouth daily., Disp: , Rfl:  .  OMEGA-3-ACID ETHYL ESTERS PO, Take 1 capsule by mouth daily., Disp: , Rfl:  .  pantoprazole (PROTONIX) 40 MG tablet, Take 1 tablet (40 mg total) by mouth 2 (two) times daily., Disp: 60 tablet, Rfl: 1 .  propranolol (INDERAL) 10 MG tablet, Take 10 mg by mouth 2 (two) times daily., Disp: , Rfl: 11 .  rosuvastatin (CRESTOR) 5 MG tablet, Take 5 mg by mouth at bedtime., Disp: , Rfl:    Allergies:  Doxycycline  Review of Systems: Gen:  Denies  fever, sweats, chills HEENT: Denies blurred vision, double vision. bleeds, sore throat Cvc:  No dizziness, chest pain. Resp:   Denies cough or sputum production, shortness of breath Gi: Denies swallowing difficulty, stomach pain. Gu:  Denies bladder incontinence, burning urine Ext:   No Joint pain, stiffness. Skin: No skin rash,  hives  Endoc:  No polyuria, polydipsia. Psych: No depression, insomnia. Other:  All other systems were reviewed with the patient and were negative other that what is mentioned in the HPI.   Physical Examination:   VS: BP (!) 92/56 (BP Location: Left Arm, Cuff Size: Normal)   Pulse 68   Ht 5\' 7"  (1.702 m)   Wt 151 lb (68.5 kg)   SpO2 99%   BMI 23.65 kg/m   General Appearance: No distress  Neuro:without focal findings,  speech normal,  HEENT: PERRLA, EOM intact.  Mallampati 3 Pulmonary: normal breath sounds, No wheezing.  CardiovascularNormal S1,S2.  No m/r/g.   Abdomen: Benign, Soft,  non-tender. Renal:  No costovertebral tenderness  GU:  No performed at this time. Endoc: No evident thyromegaly, no signs of acromegaly. Skin:   warm, no rashes, no ecchymosis  Extremities: normal, no cyanosis, clubbing.  Other findings:    LABORATORY PANEL:   CBC No results for input(s): WBC, HGB, HCT, PLT in the last 168 hours. ------------------------------------------------------------------------------------------------------------------  Chemistries  No results for input(s): NA, K, CL, CO2, GLUCOSE, BUN, CREATININE, CALCIUM, MG, AST, ALT, ALKPHOS, BILITOT in the last 168 hours.  Invalid input(s): GFRCGP ------------------------------------------------------------------------------------------------------------------  Cardiac Enzymes No results for input(s): TROPONINI in the last 168 hours. ------------------------------------------------------------  RADIOLOGY:  No results found.     Thank  you for the consultation and for allowing [facility] to assist in the care of your patient. Our recommendations are noted above.  Please contact us if we can be of further service.   Wells Guileseep Maragret Vanacker, M.D., F.C.C.P.  Board Certified in Internal Medicine, Pulmonary Medicine, Critical Care Medicine, and Sleep Medicine.  [place] [facility] Office Number: [phone]   02/23/2018

## 2018-03-17 DIAGNOSIS — R109 Unspecified abdominal pain: Secondary | ICD-10-CM | POA: Insufficient documentation

## 2018-03-17 DIAGNOSIS — K219 Gastro-esophageal reflux disease without esophagitis: Secondary | ICD-10-CM | POA: Insufficient documentation

## 2018-03-24 ENCOUNTER — Other Ambulatory Visit
Admission: RE | Admit: 2018-03-24 | Discharge: 2018-03-24 | Disposition: A | Discharge status: Home / Self Care | Payer: 59 | Source: Ambulatory Visit | Attending: Nurse Practitioner | Admitting: Nurse Practitioner

## 2018-03-24 DIAGNOSIS — R748 Abnormal levels of other serum enzymes: Secondary | ICD-10-CM | POA: Insufficient documentation

## 2018-03-24 DIAGNOSIS — R197 Diarrhea, unspecified: Secondary | ICD-10-CM | POA: Insufficient documentation

## 2018-03-24 LAB — GASTROINTESTINAL PANEL BY PCR, STOOL (REPLACES STOOL CULTURE)

## 2018-03-25 LAB — CALPROTECTIN, FECAL: Calprotectin, Fecal: <16 ug/g (ref 0–120)

## 2018-03-25 LAB — PANCREATIC ELASTASE, FECAL: Pancreatic Elastase-1, Stool: 398 ug Elast./g (ref >200)

## 2018-03-30 ENCOUNTER — Ambulatory Visit: Payer: 59 | Attending: Internal Medicine

## 2018-03-30 DIAGNOSIS — R0600 Dyspnea, unspecified: Secondary | ICD-10-CM | POA: Insufficient documentation

## 2018-03-30 MED ORDER — ALBUTEROL SULFATE (2.5 MG/3ML) 0.083% IN NEBU
2.5000 mg | INHALATION_SOLUTION | Freq: Once | RESPIRATORY_TRACT | Status: AC
  Filled 2018-03-30: qty 3

## 2018-04-06 ENCOUNTER — Ambulatory Visit: Payer: 59 | Admitting: Internal Medicine

## 2018-04-06 NOTE — Progress Notes (Deleted)
[name]     Assessment and Plan:  Obstructive sleep apnea. -Known obstructive sleep apnea, currently on BiPAP with continued symptoms of excessive daytime sleepiness.  Did not tolerate the initial high pressure of 19/15, then decrease to 12/5, then 10/8.   Dyspnea on exertion and at rest. -Uncertain as to why she is developing dyspnea which is atypical, comes on at rest or with activity. - I have asked her to stop vaping, in addition I am giving her an albuterol inhaler to be used as needed for dyspnea.  Nicotine abuse. - Currently vaping, discussed the importance of quitting.  Paresthesias in arms - Paresthesias in arms which comes on occasionally, as well as a right flank pain.  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  Crohn's disease. -Patient has a history of Crohn's disease, currently controlled without medications.  No orders of the defined types were placed in this encounter.  No orders of the defined types were placed in this encounter.  No follow-ups on file.    Date: 04/06/2018  MRN# [id number] Terri Sanchez 03/14/1971  Referring Physician: Dr. Gwen PoundsKowalski.   Terri Sanchez is a 47 y.o. old female seen in consultation for chief complaint of:    No chief complaint on file.   HPI:  The patient is a 47 year old female with a history of obstructive sleep apnea, she was intolerant to CPAP, therefore started on BiPAP initially at a pressure of 19/15, subsequently decreased to current level of 10/8 due to intolerance of high pressures. Recently she was having back pain overnight which was progressive, she had a CT scan in the ED which showed some mild atelectasis.    She denies reflux, or sinus drainage, she has 2 cats, not in bedroom.  She has been tested for allergies remotely, does not recall allergies.  No recent travel. 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  She is  no longer snoring, she is still somewhat sleepy during the day, but not as much. Her original setting felt too much and could not be tolerated, so was reduced to 12/8; then lowered to 10/8.   **CTAP 01/05/18 (report); miminal bibasilar atelectasis.  **CPAP titration 10/28/2017>> PLM arousal index of 2.1.  Recommended to be on BiPAP "15/19".  Review of raw data shows AHI improved to below 15 with CPAP pressure of 12.  Supine REM was not achieved.  The patient was switched to BiPAP 18/14 with AHI of 0 and changed to BiPAP 19/15  Medication:    Current Outpatient Medications:  .  acidophilus (RISAQUAD) CAPS capsule, Take 1 capsule by mouth daily., Disp: , Rfl:  .  albuterol (PROAIR HFA) 108 (90 Base) MCG/ACT inhaler, Inhale 1-2 puffs into the lungs every 6 (six) hours as needed for wheezing or shortness of breath., Disp: 1 Inhaler, Rfl: 5 .  Multiple Vitamin (MULTIVITAMIN) tablet, Take 1 tablet by mouth daily., Disp: , Rfl:  .  OMEGA-3-ACID ETHYL ESTERS PO, Take 1 capsule by mouth daily., Disp: , Rfl:  .  pantoprazole (PROTONIX) 40 MG tablet, Take 1 tablet (40 mg total) by mouth 2 (two) times daily., Disp: 60 tablet, Rfl: 1 .  propranolol (INDERAL) 10 MG tablet, Take 10 mg by mouth 2 (two) times daily., Disp: , Rfl: 11 .  rosuvastatin (CRESTOR) 5 MG tablet, Take 5 mg by mouth at bedtime., Disp: , Rfl:  No current facility-administered medications for this visit.  Facility-Administered Medications Ordered in Other Visits:  .  albuterol (PROVENTIL) (2.5 MG/3ML) 0.083% nebulizer solution 2.5 mg, 2.5 mg, Nebulization, Once, Shane Crutchamachandran, Bienvenido Proehl, MD   Allergies:  Doxycycline      LABORATORY PANEL:   CBC No results for input(s): WBC, HGB, HCT, PLT in the last 168 hours. ------------------------------------------------------------------------------------------------------------------  Chemistries  No results for input(s): NA, K, CL, CO2, GLUCOSE, BUN, CREATININE, CALCIUM, MG, AST, ALT,  ALKPHOS, BILITOT in the last 168 hours.  Invalid input(s): GFRCGP ------------------------------------------------------------------------------------------------------------------  Cardiac Enzymes No results for input(s): TROPONINI in the last 168 hours. ------------------------------------------------------------  RADIOLOGY:  No results found.     Thank  you for the consultation and for allowing [facility] to assist in the care of your patient. Our recommendations are noted above.  Please contact us if we can be of further service.   Wells Guileseep Greene Diodato, M.D., F.C.C.P.  Board Certified in Internal Medicine, Pulmonary Medicine, Critical Care Medicine, and Sleep Medicine.  [place] [facility] Office Number: [phone]   04/06/2018

## 2018-04-12 ENCOUNTER — Encounter: Payer: Self-pay | Admitting: Internal Medicine

## 2018-04-12 NOTE — Progress Notes (Signed)
[facility]     Assessment and Plan:  Obstructive sleep apnea. -Known obstructive sleep apnea, currently on BiPAP with continued symptoms of excessive daytime sleepiness.  Did not tolerate the initial high pressure of 19/15, then decrease to 12/5, then 10/8. Then titrated back up again to BiPAP pressure 14/10 which appears well tolerated with residual AHI of 5. --Continue bipap.   Dyspnea on exertion and at rest. -PFT was normal. Pt was given reassurance that she does not have evidence of restrictive or obstructive lung disease.   Nicotine abuse. - No longer smoking.   Crohn's disease. -Patient has a history of Crohn's disease, currently controlled without medications.   Return in about 6 months (around 10/14/2018).    Date: 04/12/2018  MRN# [id number] Terri Sanchez 10/17/1970  Referring Physician: Dr. Gwen PoundsKowalski.   Terri Sanchez is a 47 y.o. old female seen in consultation for chief complaint of:    Chief Complaint  Patient presents with  . Sleep Apnea    doing well on CPAP  . Shortness of Breath    comes and goes    HPI:  The patient is a 47 year old female with a history of obstructive sleep apnea, she was intolerant to CPAP, therefore started on BiPAP initially at a pressure of 19/15, subsequently decreased to current level of 10/8 due to intolerance of high pressures. Recently she was having back pain overnight which was progressive, she had a CT scan in the ED which showed some mild atelectasis.   She feels that she is much better with the lower level of bipap, she is no longer having tightness in her chest, when wakes in the am she feels rested, she is somewhat tired during the day but much better than before.   She denies reflux, or sinus drainage, she has 2 cats, not in bedroom.  She has been tested for allergies remotely, does not recall allergies.  No recent travel. 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  She is no longer  snoring, she is still somewhat sleepy during the day, but not as much. Her original setting felt too much and could not be tolerated, so was reduced to 12/8; then lowered to 10/8.   **BiPAP download data 03/14/2018-04/12/2018>> this is greater than 4 hours is 29/30 days.  Average usage on days used is 6 hours 40 minutes.  BiPAP pressure 14/10.  Residual AHI is 5.  Overall this shows very good compliance with BiPAP, with excellent control of obstructive sleep apnea. **CPAP titration 10/30/2017>>AHI of 67.2 changed to BiPAP, started on BiPAP.  Titrated to bilevel of 19/15, and this level AHI remained elevated at 34 **PFT 03/30/2018>> tracings personally reviewed, FVC is 100% predicted, FEV1 is 102% predicted, ratio is 83%, there is no improvement with bronchodilator.  Flow volume loop is normal.  TLC is 96% predicted, vital capacity 106% predicted, diffusion capacity is 89% predicted. -Overall this test shows normal pulmonary functions without evidence of obstructive or restrictive lung disease. **CTAP 01/05/18 (report); miminal bibasilar atelectasis.  **CPAP titration 10/28/2017>> PLM arousal index of 2.1.  Recommended to be on BiPAP "15/19".  Review of raw data shows AHI improved to below 15 with CPAP pressure of 12.  Supine REM was not achieved.  The patient was switched to BiPAP 18/14 with AHI of 0 and changed to BiPAP 19/15  Medication:    Current Outpatient Medications:  .  acidophilus (RISAQUAD) CAPS capsule, Take 1 capsule by mouth daily., Disp: ,  Rfl:  .  albuterol (PROAIR HFA) 108 (90 Base) MCG/ACT inhaler, Inhale 1-2 puffs into the lungs every 6 (six) hours as needed for wheezing or shortness of breath., Disp: 1 Inhaler, Rfl: 5 .  Multiple Vitamin (MULTIVITAMIN) tablet, Take 1 tablet by mouth daily., Disp: , Rfl:  .  OMEGA-3-ACID ETHYL ESTERS PO, Take 1 capsule by mouth daily., Disp: , Rfl:  .  pantoprazole (PROTONIX) 40 MG tablet, Take 1 tablet (40 mg total) by mouth 2 (two) times daily., Disp: 60  tablet, Rfl: 1 .  propranolol (INDERAL) 10 MG tablet, Take 10 mg by mouth 2 (two) times daily., Disp: , Rfl: 11 .  rosuvastatin (CRESTOR) 5 MG tablet, Take 5 mg by mouth at bedtime., Disp: , Rfl:  No current facility-administered medications for this visit.   Facility-Administered Medications Ordered in Other Visits:  .  albuterol (PROVENTIL) (2.5 MG/3ML) 0.083% nebulizer solution 2.5 mg, 2.5 mg, Nebulization, Once, Shane Crutchamachandran, Norely Schlick, MD   Allergies:  Doxycycline  Review of Systems:  Constitutional: Feels well. Cardiovascular: No chest pain.  Pulmonary: Denies dyspnea.   The remainder of systems were reviewed and were found to be negative other than what is documented in the HPI.    Physical Examination:   VS: BP 112/72 (BP Location: Left Arm, Cuff Size: Normal)   Pulse 77   Ht 5\' 7"  (1.702 m)   Wt 159 lb (72.1 kg)   SpO2 98%   BMI 24.90 kg/m   General Appearance: No distress  Neuro:without focal findings, mental status, speech normal, alert and oriented HEENT: PERRLA, EOM intact Pulmonary: No wheezing, No rales  CardiovascularNormal S1,S2.  No m/r/g.  Abdomen: Benign, Soft, non-tender, No masses Renal:  No costovertebral tenderness  GU:  No performed at this time. Endoc: No evident thyromegaly, no signs of acromegaly or Cushing features Skin:   warm, no rashes, no ecchymosis  Extremities: normal, no cyanosis, clubbing.      LABORATORY PANEL:   CBC No results for input(s): WBC, HGB, HCT, PLT in the last 168 hours. ------------------------------------------------------------------------------------------------------------------  Chemistries  No results for input(s): NA, K, CL, CO2, GLUCOSE, BUN, CREATININE, CALCIUM, MG, AST, ALT, ALKPHOS, BILITOT in the last 168 hours.  Invalid input(s): GFRCGP ------------------------------------------------------------------------------------------------------------------  Cardiac Enzymes No results for input(s): TROPONINI  in the last 168 hours. ------------------------------------------------------------  RADIOLOGY:  No results found.     Thank  you for the consultation and for allowing [facility] to assist in the care of your patient. Our recommendations are noted above.  Please contact us if we can be of further service.   Wells Guileseep Leoncio Hansen, M.D., F.C.C.P.  Board Certified in Internal Medicine, Pulmonary Medicine, Critical Care Medicine, and Sleep Medicine.  [facility] Office Number: [phone]   04/12/2018

## 2018-04-13 ENCOUNTER — Encounter: Payer: Self-pay | Admitting: Internal Medicine

## 2018-04-13 ENCOUNTER — Ambulatory Visit (INDEPENDENT_AMBULATORY_CARE_PROVIDER_SITE_OTHER): Payer: 59 | Admitting: Internal Medicine

## 2018-04-13 VITALS — BP 112/72 | HR 77 | Ht 67.0 in | Wt 159.0 lb

## 2018-04-13 DIAGNOSIS — H04123 Dry eye syndrome of bilateral lacrimal glands: Secondary | ICD-10-CM | POA: Insufficient documentation

## 2018-04-13 DIAGNOSIS — G4733 Obstructive sleep apnea (adult) (pediatric): Secondary | ICD-10-CM

## 2018-04-13 DIAGNOSIS — R682 Dry mouth, unspecified: Secondary | ICD-10-CM | POA: Insufficient documentation

## 2018-04-13 DIAGNOSIS — R768 Other specified abnormal immunological findings in serum: Secondary | ICD-10-CM | POA: Insufficient documentation

## 2018-04-13 NOTE — Patient Instructions (Signed)
Continue bipap every night.

## 2018-06-30 ENCOUNTER — Other Ambulatory Visit: Payer: Self-pay | Admitting: Nurse Practitioner

## 2018-06-30 DIAGNOSIS — R748 Abnormal levels of other serum enzymes: Secondary | ICD-10-CM

## 2018-06-30 DIAGNOSIS — R1011 Right upper quadrant pain: Secondary | ICD-10-CM

## 2018-07-06 ENCOUNTER — Ambulatory Visit
Admission: RE | Admit: 2018-07-06 | Discharge: 2018-07-06 | Disposition: A | Discharge status: Home / Self Care | Payer: 59 | Source: Ambulatory Visit | Attending: Nurse Practitioner | Admitting: Nurse Practitioner

## 2018-07-06 DIAGNOSIS — Z9049 Acquired absence of other specified parts of digestive tract: Secondary | ICD-10-CM | POA: Diagnosis not present

## 2018-07-06 DIAGNOSIS — R748 Abnormal levels of other serum enzymes: Secondary | ICD-10-CM | POA: Diagnosis present

## 2018-07-06 DIAGNOSIS — R1011 Right upper quadrant pain: Secondary | ICD-10-CM | POA: Diagnosis not present

## 2019-01-23 IMAGING — US US ABDOMEN COMPLETE
1 series · 14 of 25 positions shown · non-contrast
Comparison: CT abdomen and pelvis 05/29/2016

CLINICAL DATA: Elevated LFTs, RIGHT upper quadrant pain

EXAM:
ABDOMEN ULTRASOUND COMPLETE

[Series 1: us abdomen complete · 0.26mm/px · 14 of 88 slices shown]
[im 1/88]
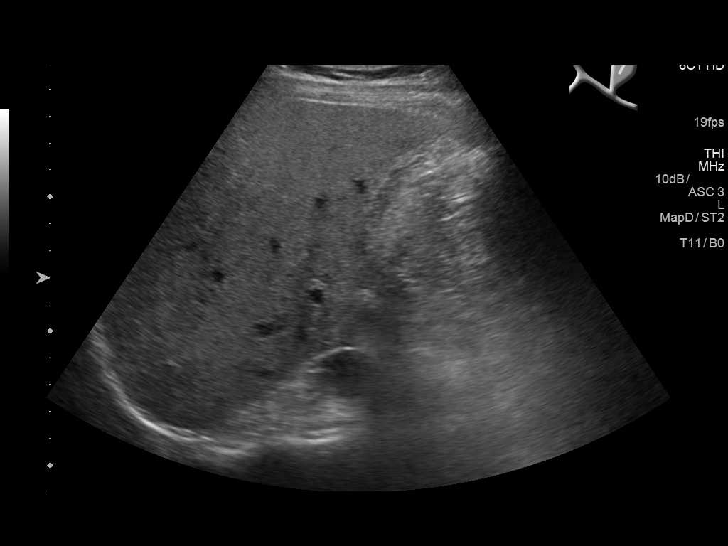
[im 8/88]
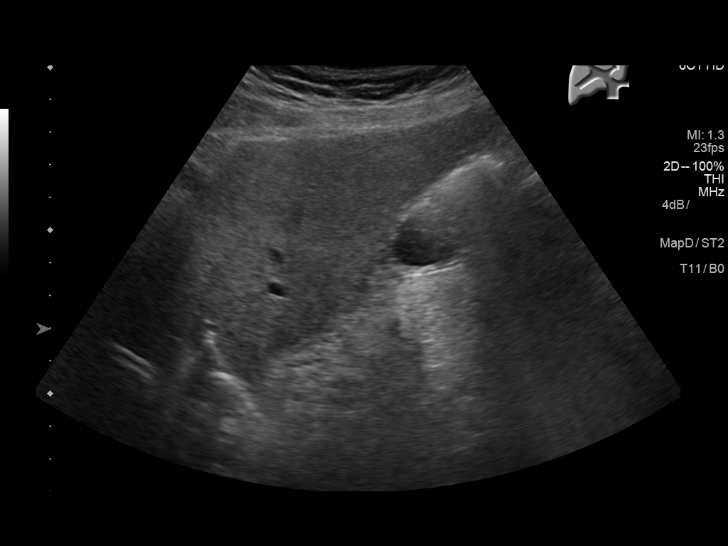
[im 15/88]
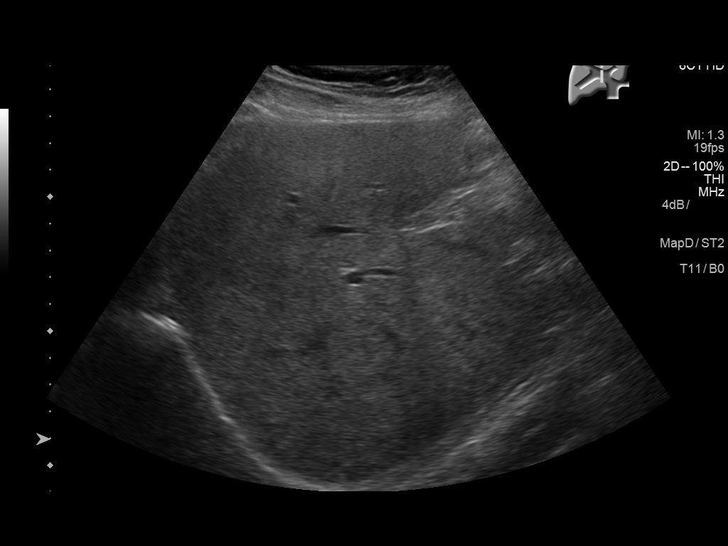
[im 22/88]
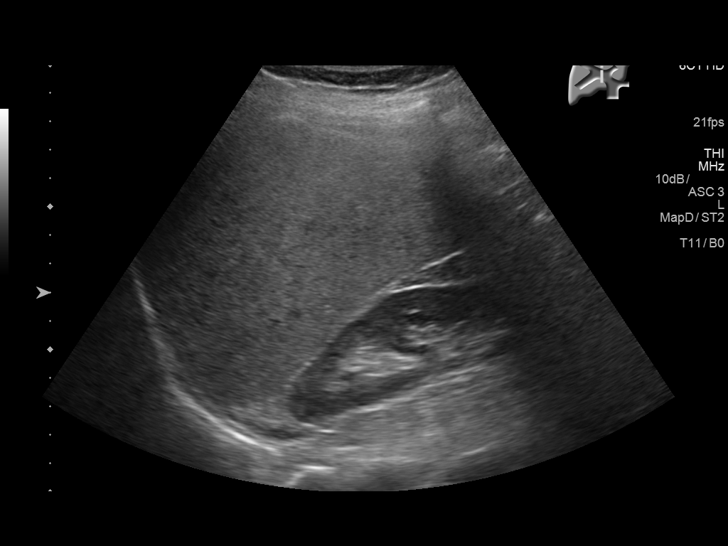
[im 30/88]
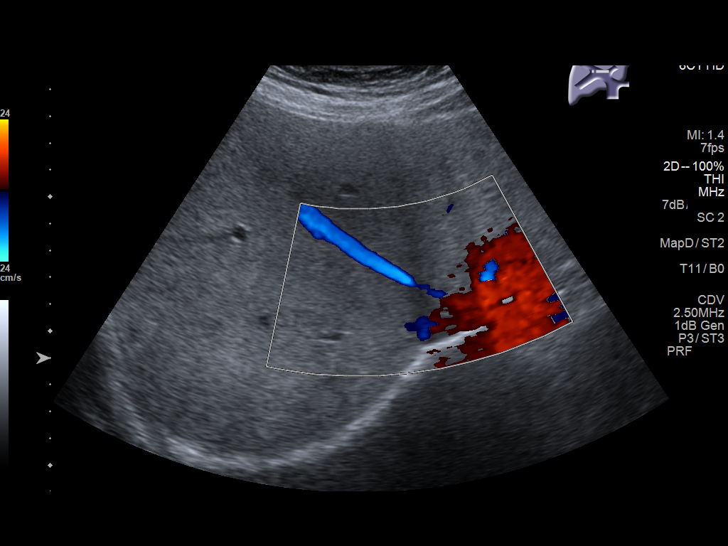
[im 33/88]
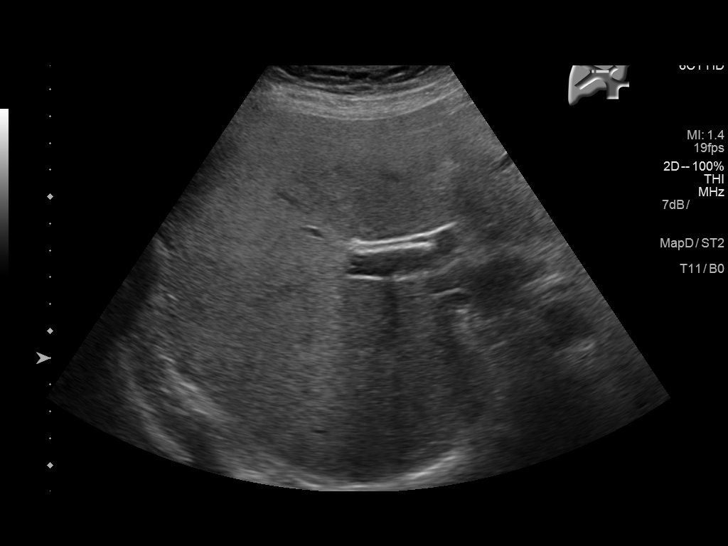
[im 40/88]
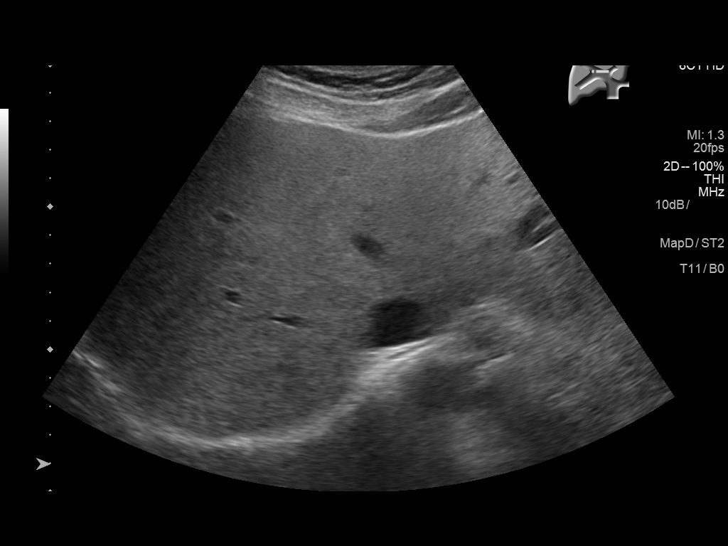
[im 48/88]
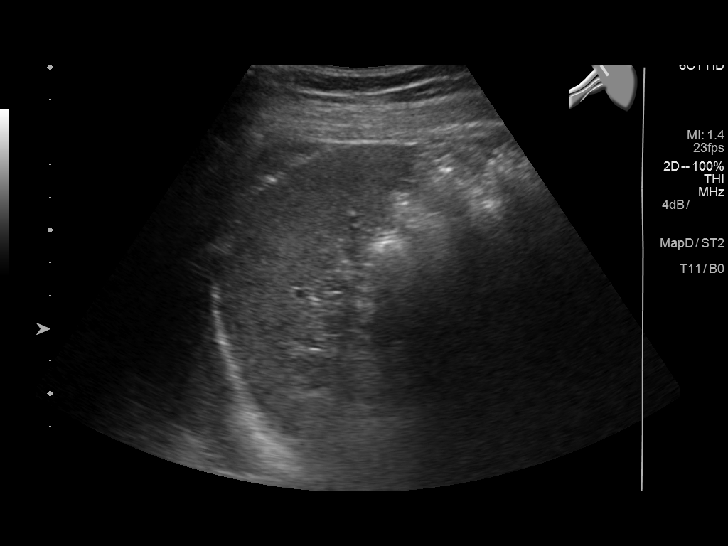
[im 55/88]
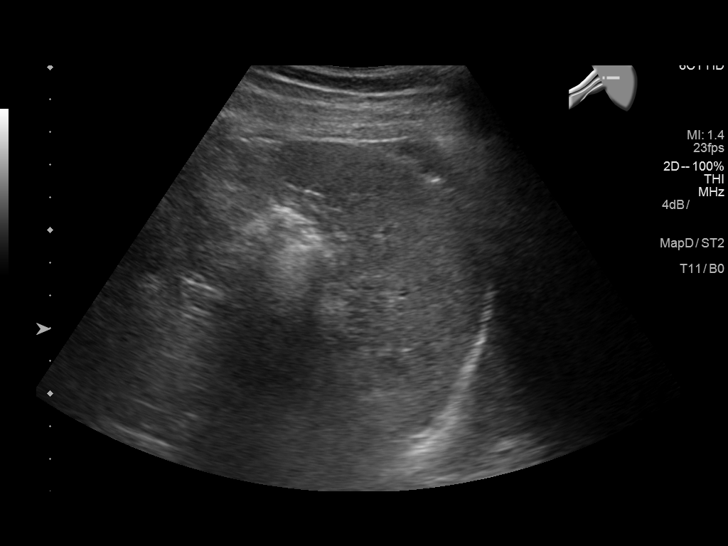
[im 59/88]
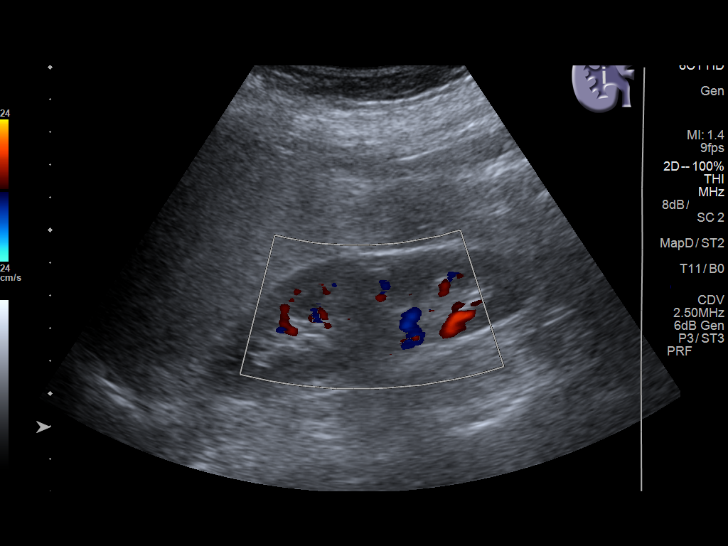
[im 66/88]
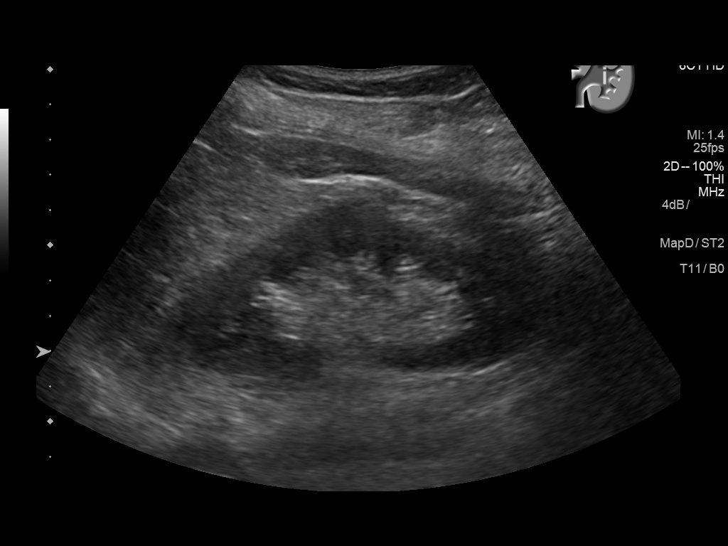
[im 73/88]
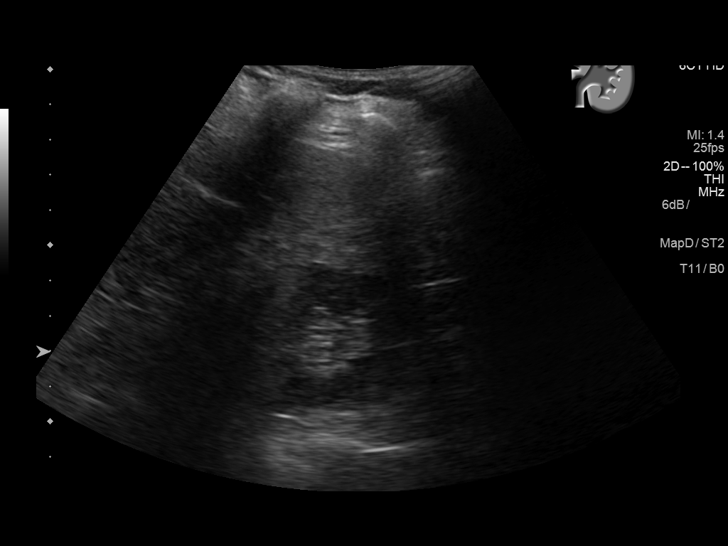
[im 80/88]
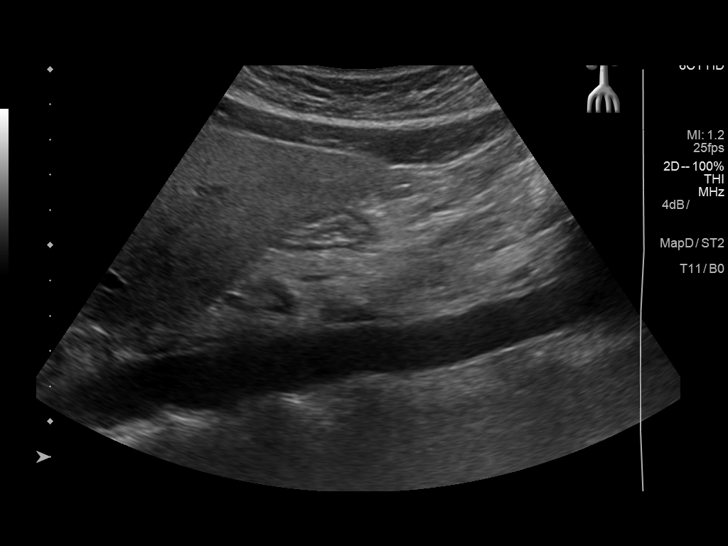
[im 88/88]
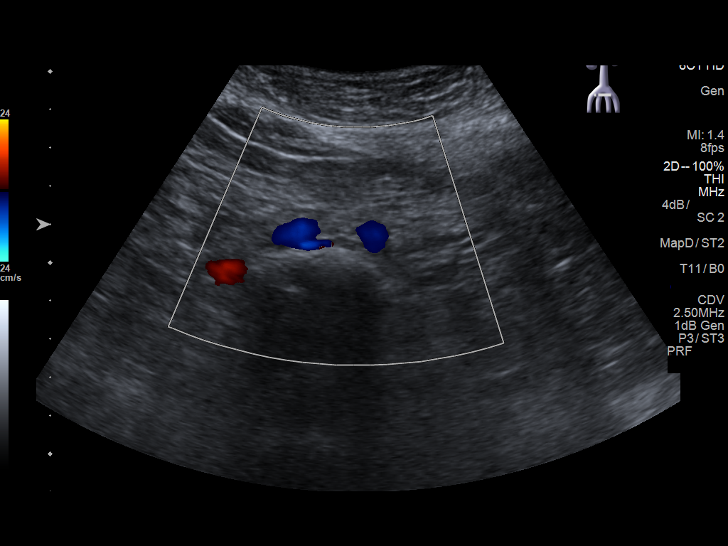

[14 of 25 positions shown; findings below may reference images not displayed]

FINDINGS: Gallbladder: Surgically absent

Common bile duct: Diameter: 5 mm diameter, normal

Liver: Echogenic parenchyma, likely fatty infiltration though this
can be seen with cirrhosis and certain infiltrative disorders.
Hepatic margins appear smooth without irregularity or nodularity. No
focal hepatic masses or intrahepatic biliary dilatation portal vein
is patent on color Doppler imaging with normal direction of blood
flow towards the liver.

IVC: Normal appearance

Pancreas: Normal appearance

Spleen: Normal appearance, 6.9 cm length

Right Kidney: Length: 10.5 cm. Normal morphology without mass or
hydronephrosis.

Left Kidney: Length: 10.5 cm. Normal morphology without mass or
hydronephrosis.

Abdominal aorta: Normal caliber

Other findings: No free fluid
IMPRESSION: Post cholecystectomy.

Probable fatty infiltration of liver.

Otherwise negative exam.

## 2022-04-10 ENCOUNTER — Other Ambulatory Visit: Payer: Self-pay | Admitting: Obstetrics and Gynecology

## 2022-04-10 DIAGNOSIS — Z1231 Encounter for screening mammogram for malignant neoplasm of breast: Secondary | ICD-10-CM

## 2022-04-30 ENCOUNTER — Ambulatory Visit
Admission: RE | Admit: 2022-04-30 | Discharge: 2022-04-30 | Disposition: A | Discharge status: Home / Self Care | Payer: BC Managed Care – PPO | Source: Ambulatory Visit | Attending: Obstetrics and Gynecology | Admitting: Obstetrics and Gynecology

## 2022-04-30 DIAGNOSIS — Z1231 Encounter for screening mammogram for malignant neoplasm of breast: Secondary | ICD-10-CM | POA: Diagnosis not present

## 2023-04-16 ENCOUNTER — Ambulatory Visit: Payer: BC Managed Care – PPO

## 2023-04-16 DIAGNOSIS — R1033 Periumbilical pain: Secondary | ICD-10-CM | POA: Diagnosis present

## 2023-04-16 DIAGNOSIS — K2980 Duodenitis without bleeding: Secondary | ICD-10-CM | POA: Diagnosis not present

## 2023-04-16 DIAGNOSIS — K2950 Unspecified chronic gastritis without bleeding: Secondary | ICD-10-CM | POA: Diagnosis not present

## 2023-04-16 DIAGNOSIS — R194 Change in bowel habit: Secondary | ICD-10-CM | POA: Diagnosis not present

## 2023-04-16 DIAGNOSIS — K200 Eosinophilic esophagitis: Secondary | ICD-10-CM | POA: Diagnosis not present

## 2023-04-16 DIAGNOSIS — K640 First degree hemorrhoids: Secondary | ICD-10-CM | POA: Diagnosis not present

## 2023-04-16 DIAGNOSIS — K5730 Diverticulosis of large intestine without perforation or abscess without bleeding: Secondary | ICD-10-CM | POA: Diagnosis not present

## 2023-04-16 DIAGNOSIS — R197 Diarrhea, unspecified: Secondary | ICD-10-CM | POA: Diagnosis not present

## 2023-05-29 ENCOUNTER — Ambulatory Visit
Admission: RE | Admit: 2023-05-29 | Discharge: 2023-05-29 | Disposition: A | Discharge status: Home / Self Care | Payer: BC Managed Care – PPO | Source: Ambulatory Visit | Attending: Gastroenterology | Admitting: Gastroenterology

## 2023-05-29 ENCOUNTER — Other Ambulatory Visit: Payer: Self-pay | Admitting: Gastroenterology

## 2023-05-29 DIAGNOSIS — R1310 Dysphagia, unspecified: Secondary | ICD-10-CM

## 2023-05-29 DIAGNOSIS — K200 Eosinophilic esophagitis: Secondary | ICD-10-CM | POA: Insufficient documentation

## 2023-05-29 DIAGNOSIS — K219 Gastro-esophageal reflux disease without esophagitis: Secondary | ICD-10-CM | POA: Diagnosis present

## 2023-11-05 ENCOUNTER — Other Ambulatory Visit: Payer: Self-pay | Admitting: Internal Medicine

## 2023-11-05 DIAGNOSIS — R1032 Left lower quadrant pain: Secondary | ICD-10-CM

## 2023-11-09 ENCOUNTER — Ambulatory Visit
Admission: RE | Admit: 2023-11-09 | Discharge: 2023-11-09 | Disposition: A | Discharge status: Home / Self Care | Source: Ambulatory Visit | Attending: Internal Medicine | Admitting: Internal Medicine

## 2023-11-09 DIAGNOSIS — R1032 Left lower quadrant pain: Secondary | ICD-10-CM | POA: Diagnosis present

## 2023-11-09 MED ORDER — IOHEXOL 300 MG/ML  SOLN
100.0000 mL | Freq: Once | INTRAMUSCULAR | Status: AC | PRN
  Administered 2023-11-09: 100 mL via INTRAVENOUS

## 2023-12-23 ENCOUNTER — Other Ambulatory Visit: Payer: Self-pay | Admitting: Internal Medicine

## 2023-12-23 DIAGNOSIS — Z1231 Encounter for screening mammogram for malignant neoplasm of breast: Secondary | ICD-10-CM

## 2024-06-17 ENCOUNTER — Other Ambulatory Visit (INDEPENDENT_AMBULATORY_CARE_PROVIDER_SITE_OTHER): Payer: Self-pay | Admitting: Nephrology

## 2024-06-17 DIAGNOSIS — I10 Essential (primary) hypertension: Secondary | ICD-10-CM

## 2024-06-20 ENCOUNTER — Ambulatory Visit (INDEPENDENT_AMBULATORY_CARE_PROVIDER_SITE_OTHER): Payer: Self-pay

## 2024-06-20 DIAGNOSIS — I10 Essential (primary) hypertension: Secondary | ICD-10-CM | POA: Diagnosis not present

## 2024-07-19 ENCOUNTER — Ambulatory Visit (INDEPENDENT_AMBULATORY_CARE_PROVIDER_SITE_OTHER): Admitting: Nurse Practitioner

## 2024-07-19 ENCOUNTER — Encounter (INDEPENDENT_AMBULATORY_CARE_PROVIDER_SITE_OTHER): Payer: Self-pay | Admitting: Nurse Practitioner

## 2024-07-19 VITALS — BP 126/81 | HR 81 | Resp 16 | Ht 67.0 in | Wt 135.0 lb

## 2024-07-19 DIAGNOSIS — I10 Essential (primary) hypertension: Secondary | ICD-10-CM

## 2024-07-19 DIAGNOSIS — I701 Atherosclerosis of renal artery: Secondary | ICD-10-CM

## 2024-07-19 NOTE — Progress Notes (Signed)
 Subjective:    Patient ID: Terri Sanchez, female    DOB: 10-05-1970, 53 y.o.   MRN: [id number] Chief Complaint  Patient presents with   New Patient (Initial Visit)    consult. Renal Artery Stenosis  Ref: Terri Sanchez      Discussed the use of AI scribe software for clinical note transcription with the patient, who gave verbal consent to proceed.  History of Present Illness Terri Sanchez is a 53 year old female with hypertension who presents for evaluation of renal artery stenosis. She was referred by Dr. Lazarus for evaluation of uncontrolled blood pressure and possible renal artery stenosis.  She has been experiencing difficulty in controlling her blood pressure despite maintaining a healthy lifestyle, including a balanced diet and regular exercise. Blood pressure readings have been as high as 175/104 mmHg upon waking. She is on a combination of medications, including a beta blocker and Valsartan-HCTZ. Her blood pressure has recently measured 160/81 mmHg.  There is a significant family history of cardiovascular issues, including her father who has high blood pressure, a history of stroke, diabetes, and thyroid issues. Her paternal grandfather passed away at 42 due to a massive heart attack.  She has a history of high cholesterol and was previously on medication for it. However, she had to discontinue the cholesterol medication after a bacterial infection contracted during a trip to [place]  led to elevated liver enzymes, with AST levels reaching 150. She underwent multiple rounds of antibiotics, which were believed to have affected her liver function.  She is scheduled to have her lipid levels and liver function re-evaluated on December 1st.    Results LABS Liver function test: AST 150 U/L  RADIOLOGY Renal artery ultrasound: 1-59% stenosis, velocity suggests no more than 30% stenosis   Review of Systems  All other systems reviewed and are negative.      Objective:    Physical Exam Vitals reviewed.  HENT:     Head: Normocephalic.  Cardiovascular:     Rate and Rhythm: Normal rate.  Pulmonary:     Effort: Pulmonary effort is normal.  Skin:    General: Skin is warm and dry.  Neurological:     Mental Status: She is alert and oriented to person, place, and time.  Psychiatric:        Mood and Affect: Mood normal.        Behavior: Behavior normal.        Thought Content: Thought content normal.        Judgment: Judgment normal.     Physical Exam   BP 126/81   Pulse 81   Resp 16   Ht 5' 7 (1.702 m)   Wt 135 lb (61.2 kg)   BMI 21.14 kg/m   Past Medical History:  Diagnosis Date   Bowel obstruction (HCC)    Endometriosis    Hyperlipidemia    Hypertension    Sleep apnea     Social History   Socioeconomic History   Marital status: Married    Spouse name: Not on file   Number of children: Not on file   Years of education: Not on file   Highest education level: Not on file  Occupational History   Not on file  Tobacco Use   Smoking status: Former   Smokeless tobacco: Never   Tobacco comments:    pt states she vapes  Substance and Sexual Activity   Alcohol use: Yes    Alcohol/week: 2.0 standard drinks of alcohol  Types: 2 Glasses of wine per week   Drug use: No   Sexual activity: Not on file  Other Topics Concern   Not on file  Social History Narrative   Not on file   Social Drivers of Health   Financial Resource Strain: Low Risk  (12/17/2023)   Received from [facility]   Overall Financial Resource Strain (CARDIA)    Difficulty of Paying Living Expenses: Not hard at all  Food Insecurity: No Food Insecurity (12/17/2023)   Received from [facility]   Hunger Vital Sign    Within the past 12 months, you worried that your food would run out before you got the money to buy more.: Never true    Within the past 12 months, the food you bought just didn't last and you didn't have money to get  more.: Never true  Transportation Needs: No Transportation Needs (12/17/2023)   Received from [facility] - Transportation    In the past 12 months, has lack of transportation kept you from medical appointments or from getting medications?: No    Lack of Transportation (Non-Medical): No  Physical Activity: Not on file  Stress: Not on file  Social Connections: Not on file  Intimate Partner Violence: Not on file    Past Surgical History:  Procedure Laterality Date   ABDOMINAL HYSTERECTOMY     AUGMENTATION MAMMAPLASTY Bilateral 2011   CESAREAN SECTION     CHOLECYSTECTOMY     ESOPHAGOGASTRODUODENOSCOPY N/A 06/01/2016   Procedure: ESOPHAGOGASTRODUODENOSCOPY (EGD);  Surgeon: Lamar ONEIDA Holmes, MD;  Location: [facility] ENDOSCOPY;  Service: Endoscopy;  Laterality: N/A;    Family History  Problem Relation Age of Onset   Breast cancer Neg Hx     Allergies  Allergen Reactions   Doxycycline Anaphylaxis       Latest Ref Rng & Units 06/01/2016    5:34 AM 05/30/2016    5:58 AM 05/29/2016   11:13 AM  CBC  WBC 3.6 - 11.0 K/uL 6.8  7.3  7.3   Hemoglobin 12.0 - 16.0 g/dL 87.9  87.3  85.3   Hematocrit 35.0 - 47.0 % 33.8  35.0  41.1   Platelets 150 - 440 K/uL 203  182  240       CMP     Component Value Date/Time   NA 140 06/01/2016 0534   K 3.7 06/01/2016 0534   CL 106 06/01/2016 0534   CO2 27 06/01/2016 0534   GLUCOSE 122 (H) 06/01/2016 0534   BUN 6 06/01/2016 0534   CREATININE 0.64 06/01/2016 0534   CALCIUM 9.1 06/01/2016 0534   PROT 7.8 05/29/2016 1113   ALBUMIN 4.5 05/29/2016 1113   AST 43 (H) 05/29/2016 1113   ALT 42 05/29/2016 1113   ALKPHOS 82 05/29/2016 1113   BILITOT 1.1 05/29/2016 1113   GFRNONAA >60 06/01/2016 0534     No results found.     Assessment & Plan:   1. Renal artery stenosis (Primary) Renal artery atherosclerosis with mild stenosis Bilateral renal artery stenosis around 30%, not significantly affecting blood pressure.  Effective management with beta blocker and valsartan. No aortic aneurysm. Genetic factors and lifestyle modifications considered. - Continue beta blocker and valsartan. - Monitor blood pressure and renal artery stenosis annually. - Follow up with lipid panel and liver function tests with primary care provider. - Consider reintroduction of statin therapy if liver function allows, per primary care provider's assessment.  2. Benign essential HTN  Continue antihypertensive medications as already ordered, these medications have been reviewed and there are no changes at this time.   Current Outpatient Medications on File Prior to Visit  Medication Sig Dispense Refill   amoxicillin-clavulanate (AUGMENTIN) 875-125 MG tablet Take 1 tablet by mouth.     colestipol (COLESTID) 1 g tablet Take 1 g by mouth.     meloxicam (MOBIC) 15 MG tablet Take 15 mg by mouth.     pantoprazole  (PROTONIX ) 40 MG tablet Take 1 tablet (40 mg total) by mouth 2 (two) times daily. 60 tablet 1   propranolol (INDERAL) 10 MG tablet Take 10 mg by mouth 2 (two) times daily.  11   valsartan-hydrochlorothiazide (DIOVAN-HCT) 160-12.5 MG tablet Take 1 tablet by mouth daily.     acidophilus (RISAQUAD) CAPS capsule Take 1 capsule by mouth daily.     albuterol  (PROAIR  HFA) 108 (90 Base) MCG/ACT inhaler Inhale 1-2 puffs into the lungs every 6 (six) hours as needed for wheezing or shortness of breath. 1 Inhaler 5   Multiple Vitamin (MULTIVITAMIN) tablet Take 1 tablet by mouth daily.     OMEGA-3-ACID ETHYL ESTERS PO Take 1 capsule by mouth daily.     rosuvastatin (CRESTOR) 5 MG tablet Take 5 mg by mouth at bedtime.     Current Facility-Administered Medications on File Prior to Visit  Medication Dose Route Frequency Provider Last Rate Last Admin   albuterol  (PROVENTIL ) (2.5 MG/3ML) 0.083% nebulizer solution 2.5 mg  2.5 mg Nebulization Once Verdia Art, MD        There are no Patient Instructions on file for this visit. Return  for Renal stenosis.   Tamorah Hada E Vivi Piccirilli, NP

## 2025-07-17 ENCOUNTER — Encounter (INDEPENDENT_AMBULATORY_CARE_PROVIDER_SITE_OTHER)

## 2025-07-17 ENCOUNTER — Ambulatory Visit (INDEPENDENT_AMBULATORY_CARE_PROVIDER_SITE_OTHER): Admitting: Nurse Practitioner
